# Patient Record
Sex: Male | Born: 1960 | Race: White | Hispanic: No | Marital: Married | Smoking: Never smoker
Health system: Southern US, Community
[De-identification: ages and names within clinical notes are randomized; demographics above are authoritative.]

## PROBLEM LIST (undated history)

## (undated) HISTORY — DX: Hemochromatosis, unspecified: E83.119

---

## 2006-01-25 ENCOUNTER — Ambulatory Visit: Payer: Self-pay | Admitting: Hematology & Oncology

## 2006-01-31 ENCOUNTER — Encounter: Admission: RE | Admit: 2006-01-31 | Discharge: 2006-01-31 | Payer: Self-pay | Admitting: Family Medicine

## 2006-02-04 LAB — CBC & DIFF AND RETIC
Basophils Absolute: 0 10*3/uL (ref 0.0–0.1)
Eosinophils Absolute: 0.1 10*3/uL (ref 0.0–0.5)
IRF: 0.22 (ref 0.070–0.380)
MONO#: 0.4 10*3/uL (ref 0.1–0.9)
NEUT%: 65.5 % (ref 40.0–75.0)
Platelets: 318 10*3/uL (ref 145–400)
RDW: 12.4 % (ref 11.2–14.6)
RETIC #: 41.2 10*3/uL (ref 31.8–103.9)
lymph#: 1.7 10*3/uL (ref 0.9–3.3)

## 2006-02-04 LAB — HEPATIC FUNCTION PANEL
ALT: 35 U/L (ref 0–40)
Albumin: 4.8 g/dL (ref 3.5–5.2)
Total Protein: 8.3 g/dL (ref 6.0–8.3)

## 2006-02-04 LAB — IRON AND TIBC
%SAT: 71 % — ABNORMAL HIGH (ref 20–55)
Iron: 234 ug/dL — ABNORMAL HIGH (ref 42–165)
TIBC: 330 ug/dL (ref 215–435)

## 2006-02-05 LAB — FERRITIN: Ferritin: 382 ng/mL — ABNORMAL HIGH (ref 22–322)

## 2006-02-11 LAB — CBC WITH DIFFERENTIAL/PLATELET
BASO%: 0.5 % (ref 0.0–2.0)
EOS%: 1.6 % (ref 0.0–7.0)
HCT: 44.2 % (ref 38.7–49.9)
LYMPH%: 38.1 % (ref 14.0–48.0)
MCH: 32.5 pg (ref 28.0–33.4)
MCHC: 34.8 g/dL (ref 32.0–35.9)
NEUT%: 52.3 % (ref 40.0–75.0)
Platelets: 313 10*3/uL (ref 145–400)
RBC: 4.73 10*6/uL (ref 4.20–5.71)
WBC: 5.3 10*3/uL (ref 4.0–10.0)

## 2006-02-18 LAB — CBC WITH DIFFERENTIAL/PLATELET
BASO%: 0.3 % (ref 0.0–2.0)
EOS%: 1 % (ref 0.0–7.0)
MCH: 32.3 pg (ref 28.0–33.4)
MCHC: 34.3 g/dL (ref 32.0–35.9)
MONO#: 0.4 10*3/uL (ref 0.1–0.9)
RBC: 4.48 10*6/uL (ref 4.20–5.71)
RDW: 12.2 % (ref 11.2–14.6)
WBC: 7.4 10*3/uL (ref 4.0–10.0)
lymph#: 2 10*3/uL (ref 0.9–3.3)

## 2006-02-25 LAB — CBC WITH DIFFERENTIAL/PLATELET
Basophils Absolute: 0.1 10*3/uL (ref 0.0–0.1)
EOS%: 1.6 % (ref 0.0–7.0)
Eosinophils Absolute: 0.1 10*3/uL (ref 0.0–0.5)
HGB: 14.3 g/dL (ref 13.0–17.1)
MCH: 33.3 pg (ref 28.0–33.4)
MONO#: 0.3 10*3/uL (ref 0.1–0.9)
NEUT#: 4 10*3/uL (ref 1.5–6.5)
RDW: 12.1 % (ref 11.2–14.6)
WBC: 6.6 10*3/uL (ref 4.0–10.0)
lymph#: 2.1 10*3/uL (ref 0.9–3.3)

## 2006-03-16 ENCOUNTER — Ambulatory Visit: Payer: Self-pay | Admitting: Hematology & Oncology

## 2006-03-18 LAB — CBC WITH DIFFERENTIAL/PLATELET
BASO%: 0.6 % (ref 0.0–2.0)
Eosinophils Absolute: 0.1 10*3/uL (ref 0.0–0.5)
HCT: 42.5 % (ref 38.7–49.9)
LYMPH%: 34.9 % (ref 14.0–48.0)
MCHC: 34.6 g/dL (ref 32.0–35.9)
MCV: 96.5 fL (ref 81.6–98.0)
MONO#: 0.5 10*3/uL (ref 0.1–0.9)
MONO%: 8.8 % (ref 0.0–13.0)
NEUT%: 53.8 % (ref 40.0–75.0)
Platelets: 292 10*3/uL (ref 145–400)
RBC: 4.4 10*6/uL (ref 4.20–5.71)
WBC: 6.1 10*3/uL (ref 4.0–10.0)

## 2006-04-15 LAB — FERRITIN: Ferritin: 111 ng/mL (ref 22–322)

## 2006-06-08 ENCOUNTER — Ambulatory Visit: Payer: Self-pay | Admitting: Hematology & Oncology

## 2006-08-03 ENCOUNTER — Ambulatory Visit: Payer: Self-pay | Admitting: Hematology & Oncology

## 2006-08-12 LAB — CBC WITH DIFFERENTIAL/PLATELET
Basophils Absolute: 0 10*3/uL (ref 0.0–0.1)
EOS%: 2.1 % (ref 0.0–7.0)
HGB: 14.1 g/dL (ref 13.0–17.1)
MCH: 33.2 pg (ref 28.0–33.4)
NEUT#: 2.6 10*3/uL (ref 1.5–6.5)
RBC: 4.26 10*6/uL (ref 4.20–5.71)
RDW: 12.6 % (ref 11.2–14.6)
lymph#: 1.9 10*3/uL (ref 0.9–3.3)

## 2006-08-12 LAB — FERRITIN: Ferritin: 143 ng/mL (ref 22–322)

## 2006-08-19 LAB — CBC WITH DIFFERENTIAL/PLATELET
Basophils Absolute: 0 10*3/uL (ref 0.0–0.1)
Eosinophils Absolute: 0.1 10*3/uL (ref 0.0–0.5)
HGB: 12.5 g/dL — ABNORMAL LOW (ref 13.0–17.1)
LYMPH%: 25.2 % (ref 14.0–48.0)
MONO#: 0.5 10*3/uL (ref 0.1–0.9)
NEUT#: 3.7 10*3/uL (ref 1.5–6.5)
Platelets: 277 10*3/uL (ref 145–400)
RBC: 3.75 10*6/uL — ABNORMAL LOW (ref 4.20–5.71)
WBC: 5.7 10*3/uL (ref 4.0–10.0)

## 2006-09-16 ENCOUNTER — Ambulatory Visit: Payer: Self-pay | Admitting: Hematology & Oncology

## 2006-09-16 LAB — CBC WITH DIFFERENTIAL/PLATELET
BASO%: 0.4 % (ref 0.0–2.0)
EOS%: 1.6 % (ref 0.0–7.0)
LYMPH%: 32.9 % (ref 14.0–48.0)
MCHC: 34.9 g/dL (ref 32.0–35.9)
MONO#: 0.4 10*3/uL (ref 0.1–0.9)
RBC: 4.66 10*6/uL (ref 4.20–5.71)
WBC: 4.5 10*3/uL (ref 4.0–10.0)
lymph#: 1.5 10*3/uL (ref 0.9–3.3)

## 2006-09-16 LAB — FERRITIN: Ferritin: 90 ng/mL (ref 22–322)

## 2006-10-14 LAB — CBC WITH DIFFERENTIAL/PLATELET
BASO%: 1.2 % (ref 0.0–2.0)
Eosinophils Absolute: 0.1 10*3/uL (ref 0.0–0.5)
LYMPH%: 30.5 % (ref 14.0–48.0)
MCHC: 34.1 g/dL (ref 32.0–35.9)
MCV: 95.7 fL (ref 81.6–98.0)
MONO%: 8.9 % (ref 0.0–13.0)
NEUT#: 3.4 10*3/uL (ref 1.5–6.5)
Platelets: 288 10*3/uL (ref 145–400)
RBC: 4.93 10*6/uL (ref 4.20–5.71)
RDW: 11.9 % (ref 11.2–14.6)
WBC: 5.8 10*3/uL (ref 4.0–10.0)

## 2006-10-14 LAB — FERRITIN: Ferritin: 100 ng/mL (ref 22–322)

## 2006-11-22 ENCOUNTER — Ambulatory Visit: Payer: Self-pay | Admitting: Hematology & Oncology

## 2006-12-09 LAB — CBC WITH DIFFERENTIAL/PLATELET
BASO%: 0.3 % (ref 0.0–2.0)
HCT: 43.8 % (ref 38.7–49.9)
MCHC: 35.4 g/dL (ref 32.0–35.9)
MONO#: 0.5 10*3/uL (ref 0.1–0.9)
NEUT%: 55 % (ref 40.0–75.0)
RDW: 12 % (ref 11.2–14.6)
WBC: 5.4 10*3/uL (ref 4.0–10.0)
lymph#: 1.8 10*3/uL (ref 0.9–3.3)

## 2006-12-09 LAB — IRON AND TIBC
%SAT: 42 % (ref 20–55)
Iron: 130 ug/dL (ref 42–165)
TIBC: 311 ug/dL (ref 215–435)
UIBC: 181 ug/dL

## 2007-01-04 ENCOUNTER — Ambulatory Visit: Payer: Self-pay | Admitting: Hematology & Oncology

## 2007-01-13 LAB — CBC WITH DIFFERENTIAL/PLATELET
EOS%: 2 % (ref 0.0–7.0)
LYMPH%: 32.2 % (ref 14.0–48.0)
MCH: 33.1 pg (ref 28.0–33.4)
MCHC: 35.5 g/dL (ref 32.0–35.9)
MCV: 93.2 fL (ref 81.6–98.0)
MONO%: 8.4 % (ref 0.0–13.0)
RBC: 4 10*6/uL — ABNORMAL LOW (ref 4.20–5.71)
RDW: 13.6 % (ref 11.2–14.6)

## 2007-01-13 LAB — FERRITIN: Ferritin: 53 ng/mL (ref 22–322)

## 2007-02-15 ENCOUNTER — Ambulatory Visit: Payer: Self-pay | Admitting: Hematology & Oncology

## 2007-02-17 LAB — CBC WITH DIFFERENTIAL/PLATELET
BASO%: 0.9 % (ref 0.0–2.0)
EOS%: 1.8 % (ref 0.0–7.0)
Eosinophils Absolute: 0.1 10*3/uL (ref 0.0–0.5)
HGB: 14.7 g/dL (ref 13.0–17.1)
MCV: 94.7 fL (ref 81.6–98.0)
NEUT#: 2.9 10*3/uL (ref 1.5–6.5)
Platelets: 268 10*3/uL (ref 145–400)

## 2007-02-17 LAB — FERRITIN: Ferritin: 25 ng/mL (ref 22–322)

## 2007-04-04 ENCOUNTER — Ambulatory Visit: Payer: Self-pay | Admitting: Hematology & Oncology

## 2007-04-07 LAB — CBC WITH DIFFERENTIAL/PLATELET
Basophils Absolute: 0 10*3/uL (ref 0.0–0.1)
Eosinophils Absolute: 0.1 10*3/uL (ref 0.0–0.5)
HGB: 14.9 g/dL (ref 13.0–17.1)
MONO%: 9.1 % (ref 0.0–13.0)
NEUT#: 2.9 10*3/uL (ref 1.5–6.5)
RBC: 4.48 10*6/uL (ref 4.20–5.71)
RDW: 11.8 % (ref 11.2–14.6)
WBC: 5.3 10*3/uL (ref 4.0–10.0)
lymph#: 1.8 10*3/uL (ref 0.9–3.3)

## 2007-04-07 LAB — FERRITIN: Ferritin: 43 ng/mL (ref 22–322)

## 2007-05-17 ENCOUNTER — Ambulatory Visit: Payer: Self-pay | Admitting: Hematology & Oncology

## 2007-05-19 LAB — FERRITIN: Ferritin: 39 ng/mL (ref 22–322)

## 2007-06-28 ENCOUNTER — Ambulatory Visit: Payer: Self-pay | Admitting: Hematology & Oncology

## 2007-06-30 LAB — CBC WITH DIFFERENTIAL/PLATELET
Eosinophils Absolute: 0.1 10*3/uL (ref 0.0–0.5)
HCT: 39.5 % (ref 38.7–49.9)
LYMPH%: 38.4 % (ref 14.0–48.0)
MCHC: 36.3 g/dL — ABNORMAL HIGH (ref 32.0–35.9)
MONO#: 0.3 10*3/uL (ref 0.1–0.9)
NEUT#: 2.1 10*3/uL (ref 1.5–6.5)
NEUT%: 51.3 % (ref 40.0–75.0)
Platelets: 248 10*3/uL (ref 145–400)
WBC: 4.2 10*3/uL (ref 4.0–10.0)

## 2007-09-20 ENCOUNTER — Ambulatory Visit: Payer: Self-pay | Admitting: Hematology & Oncology

## 2007-11-22 ENCOUNTER — Ambulatory Visit: Payer: Self-pay | Admitting: Hematology & Oncology

## 2007-11-24 LAB — CBC WITH DIFFERENTIAL/PLATELET
EOS%: 1.7 % (ref 0.0–7.0)
MCH: 31 pg (ref 28.0–33.4)
MCHC: 32.3 g/dL (ref 32.0–35.9)
MCV: 95.7 fL (ref 81.6–98.0)
MONO%: 8.5 % (ref 0.0–13.0)
RBC: 4.39 10*6/uL (ref 4.20–5.71)
RDW: 12.3 % (ref 11.2–14.6)

## 2007-11-24 LAB — FERRITIN: Ferritin: 37 ng/mL (ref 22–322)

## 2008-01-17 ENCOUNTER — Ambulatory Visit: Payer: Self-pay | Admitting: Hematology & Oncology

## 2008-03-07 ENCOUNTER — Encounter: Admission: RE | Admit: 2008-03-07 | Discharge: 2008-03-07 | Payer: Self-pay | Admitting: Otolaryngology

## 2008-03-19 ENCOUNTER — Ambulatory Visit: Payer: Self-pay | Admitting: Hematology & Oncology

## 2008-03-22 LAB — CBC WITH DIFFERENTIAL/PLATELET
BASO%: 0.8 % (ref 0.0–2.0)
Eosinophils Absolute: 0.1 10*3/uL (ref 0.0–0.5)
HCT: 41.3 % (ref 38.7–49.9)
MCH: 32.7 pg (ref 28.0–33.4)
MCHC: 35.6 g/dL (ref 32.0–35.9)
MCV: 91.8 fL (ref 81.6–98.0)
MONO#: 0.4 10*3/uL (ref 0.1–0.9)
MONO%: 7.7 % (ref 0.0–13.0)
NEUT%: 52.1 % (ref 40.0–75.0)
RBC: 4.5 10*6/uL (ref 4.20–5.71)
RDW: 13.2 % (ref 11.2–14.6)
WBC: 4.6 10*3/uL (ref 4.0–10.0)

## 2008-03-22 LAB — IRON AND TIBC
%SAT: 66 % — ABNORMAL HIGH (ref 20–55)
Iron: 207 ug/dL — ABNORMAL HIGH (ref 42–165)
TIBC: 312 ug/dL (ref 215–435)
UIBC: 105 ug/dL

## 2008-06-20 ENCOUNTER — Ambulatory Visit: Payer: Self-pay | Admitting: Hematology & Oncology

## 2008-09-12 ENCOUNTER — Ambulatory Visit: Payer: Self-pay | Admitting: Hematology & Oncology

## 2008-09-13 LAB — CBC WITH DIFFERENTIAL (CANCER CENTER ONLY)
BASO#: 0.1 10*3/uL (ref 0.0–0.2)
Eosinophils Absolute: 0.1 10*3/uL (ref 0.0–0.5)
HGB: 15.3 g/dL (ref 13.0–17.1)
LYMPH%: 18 % (ref 14.0–48.0)
MCH: 33.1 pg (ref 28.0–33.4)
MCV: 97 fL (ref 82–98)
MONO#: 0.6 10*3/uL (ref 0.1–0.9)
MONO%: 7.1 % (ref 0.0–13.0)
Platelets: 275 10*3/uL (ref 145–400)
RBC: 4.63 10*6/uL (ref 4.20–5.70)
WBC: 8.6 10*3/uL (ref 4.0–10.0)

## 2008-09-13 LAB — IRON AND TIBC
%SAT: 56 % — ABNORMAL HIGH (ref 20–55)
Iron: 175 ug/dL — ABNORMAL HIGH (ref 42–165)
TIBC: 313 ug/dL (ref 215–435)
UIBC: 138 ug/dL

## 2008-09-13 LAB — HEPATIC FUNCTION PANEL
Alkaline Phosphatase: 77 U/L (ref 39–117)
Bilirubin, Direct: 0.2 mg/dL (ref 0.0–0.3)
Total Bilirubin: 1.2 mg/dL (ref 0.3–1.2)

## 2008-12-12 ENCOUNTER — Ambulatory Visit: Payer: Self-pay | Admitting: Hematology & Oncology

## 2008-12-13 LAB — FERRITIN: Ferritin: 119 ng/mL (ref 22–322)

## 2009-03-13 ENCOUNTER — Ambulatory Visit: Payer: Self-pay | Admitting: Hematology & Oncology

## 2009-03-14 LAB — IRON AND TIBC
%SAT: 55 % (ref 20–55)
TIBC: 326 ug/dL (ref 215–435)

## 2009-03-14 LAB — CBC WITH DIFFERENTIAL (CANCER CENTER ONLY)
BASO#: 0 10*3/uL (ref 0.0–0.2)
BASO%: 0.5 % (ref 0.0–2.0)
EOS%: 3.2 % (ref 0.0–7.0)
HCT: 46.5 % (ref 38.7–49.9)
LYMPH%: 33.2 % (ref 14.0–48.0)
MCHC: 33 g/dL (ref 32.0–35.9)
MCV: 99 fL — ABNORMAL HIGH (ref 82–98)
MONO#: 0.3 10*3/uL (ref 0.1–0.9)
NEUT%: 54.8 % (ref 40.0–80.0)
RDW: 9.8 % — ABNORMAL LOW (ref 10.5–14.6)

## 2009-03-14 LAB — HEPATIC FUNCTION PANEL
Alkaline Phosphatase: 61 U/L (ref 39–117)
Bilirubin, Direct: 0.2 mg/dL (ref 0.0–0.3)
Indirect Bilirubin: 0.8 mg/dL (ref 0.0–0.9)
Total Bilirubin: 1 mg/dL (ref 0.3–1.2)

## 2009-06-19 ENCOUNTER — Ambulatory Visit: Payer: Self-pay | Admitting: Hematology & Oncology

## 2009-06-20 LAB — FERRITIN: Ferritin: 64 ng/mL (ref 22–322)

## 2009-09-18 ENCOUNTER — Ambulatory Visit: Payer: Self-pay | Admitting: Hematology & Oncology

## 2009-09-19 LAB — CBC WITH DIFFERENTIAL (CANCER CENTER ONLY)
Eosinophils Absolute: 0.1 10*3/uL (ref 0.0–0.5)
HCT: 42 % (ref 38.7–49.9)
HGB: 14.7 g/dL (ref 13.0–17.1)
LYMPH%: 40.3 % (ref 14.0–48.0)
MCV: 93 fL (ref 82–98)
MONO#: 0.2 10*3/uL (ref 0.1–0.9)
NEUT%: 51.6 % (ref 40.0–80.0)
RBC: 4.52 10*6/uL (ref 4.20–5.70)
WBC: 4.2 10*3/uL (ref 4.0–10.0)

## 2009-12-23 ENCOUNTER — Ambulatory Visit: Payer: Self-pay | Admitting: Hematology & Oncology

## 2010-03-26 ENCOUNTER — Ambulatory Visit: Payer: Self-pay | Admitting: Hematology & Oncology

## 2010-04-03 LAB — COMPREHENSIVE METABOLIC PANEL
AST: 26 U/L (ref 0–37)
Albumin: 4.6 g/dL (ref 3.5–5.2)
Alkaline Phosphatase: 66 U/L (ref 39–117)
BUN: 14 mg/dL (ref 6–23)
CO2: 23 mEq/L (ref 19–32)
Calcium: 9.4 mg/dL (ref 8.4–10.5)
Chloride: 103 mEq/L (ref 96–112)
Creatinine, Ser: 1.1 mg/dL (ref 0.40–1.50)
Glucose, Bld: 92 mg/dL (ref 70–99)
Sodium: 137 mEq/L (ref 135–145)

## 2010-04-03 LAB — CBC WITH DIFFERENTIAL (CANCER CENTER ONLY)
BASO#: 0 10*3/uL (ref 0.0–0.2)
BASO%: 0.6 % (ref 0.0–2.0)
EOS%: 4.4 % (ref 0.0–7.0)
Eosinophils Absolute: 0.2 10*3/uL (ref 0.0–0.5)
HCT: 43.8 % (ref 38.7–49.9)
MCHC: 34.9 g/dL (ref 32.0–35.9)
NEUT%: 50.5 % (ref 40.0–80.0)
RDW: 10.7 % (ref 10.5–14.6)
WBC: 4.3 10*3/uL (ref 4.0–10.0)

## 2010-06-30 ENCOUNTER — Ambulatory Visit: Payer: Self-pay | Admitting: Hematology & Oncology

## 2010-07-01 LAB — CBC WITH DIFFERENTIAL (CANCER CENTER ONLY)
BASO#: 0 10*3/uL (ref 0.0–0.2)
BASO%: 0.5 % (ref 0.0–2.0)
EOS%: 3.8 % (ref 0.0–7.0)
LYMPH%: 37.5 % (ref 14.0–48.0)
MCV: 99 fL — ABNORMAL HIGH (ref 82–98)
MONO#: 0.3 10*3/uL (ref 0.1–0.9)
NEUT%: 50.8 % (ref 40.0–80.0)
Platelets: 254 10*3/uL (ref 145–400)
RDW: 10.7 % (ref 10.5–14.6)

## 2010-07-01 LAB — FERRITIN: Ferritin: 134 ng/mL (ref 22–322)

## 2010-07-01 LAB — IRON AND TIBC: UIBC: 189 ug/dL

## 2010-09-24 ENCOUNTER — Ambulatory Visit: Payer: Self-pay | Admitting: Hematology & Oncology

## 2010-09-25 LAB — COMPREHENSIVE METABOLIC PANEL
AST: 25 U/L (ref 0–37)
Albumin: 4.3 g/dL (ref 3.5–5.2)
Alkaline Phosphatase: 65 U/L (ref 39–117)
BUN: 15 mg/dL (ref 6–23)
CO2: 22 mEq/L (ref 19–32)
Calcium: 9.6 mg/dL (ref 8.4–10.5)
Chloride: 104 mEq/L (ref 96–112)
Potassium: 4.5 mEq/L (ref 3.5–5.3)
Sodium: 137 mEq/L (ref 135–145)
Total Bilirubin: 0.8 mg/dL (ref 0.3–1.2)
Total Protein: 7.8 g/dL (ref 6.0–8.3)

## 2010-09-25 LAB — CBC WITH DIFFERENTIAL (CANCER CENTER ONLY)
EOS%: 3.7 % (ref 0.0–7.0)
MONO#: 0.4 10*3/uL (ref 0.1–0.9)
Platelets: 244 10*3/uL (ref 145–400)
RBC: 4.6 10*6/uL (ref 4.20–5.70)
WBC: 4.8 10*3/uL (ref 4.0–10.0)

## 2010-09-25 LAB — IRON AND TIBC
%SAT: 59 % — ABNORMAL HIGH (ref 20–55)
UIBC: 122 ug/dL

## 2010-09-25 LAB — FERRITIN: Ferritin: 65 ng/mL (ref 22–322)

## 2010-12-18 ENCOUNTER — Other Ambulatory Visit: Payer: Self-pay | Admitting: Hematology & Oncology

## 2010-12-18 ENCOUNTER — Encounter (HOSPITAL_BASED_OUTPATIENT_CLINIC_OR_DEPARTMENT_OTHER): Payer: BC Managed Care – PPO | Admitting: Hematology & Oncology

## 2010-12-18 LAB — CBC WITH DIFFERENTIAL (CANCER CENTER ONLY)
Eosinophils Absolute: 0.1 10*3/uL (ref 0.0–0.5)
MCH: 32.9 pg (ref 28.0–33.4)
MONO#: 0.5 10*3/uL (ref 0.1–0.9)
NEUT#: 2.1 10*3/uL (ref 1.5–6.5)
RBC: 4.56 10*6/uL (ref 4.20–5.70)
WBC: 4.2 10*3/uL (ref 4.0–10.0)

## 2011-01-06 ENCOUNTER — Encounter (HOSPITAL_BASED_OUTPATIENT_CLINIC_OR_DEPARTMENT_OTHER): Payer: BC Managed Care – PPO | Admitting: Hematology & Oncology

## 2011-05-06 ENCOUNTER — Encounter (HOSPITAL_BASED_OUTPATIENT_CLINIC_OR_DEPARTMENT_OTHER): Payer: BC Managed Care – PPO | Admitting: Hematology & Oncology

## 2011-05-06 ENCOUNTER — Other Ambulatory Visit: Payer: Self-pay | Admitting: Hematology & Oncology

## 2011-05-06 LAB — CBC WITH DIFFERENTIAL (CANCER CENTER ONLY)
BASO%: 0.3 % (ref 0.0–2.0)
EOS%: 1.8 % (ref 0.0–7.0)
HCT: 41 % (ref 38.7–49.9)
HGB: 15.1 g/dL (ref 13.0–17.1)
LYMPH%: 31 % (ref 14.0–48.0)
MCH: 34.4 pg — ABNORMAL HIGH (ref 28.0–33.4)
MCHC: 36.8 g/dL — ABNORMAL HIGH (ref 32.0–35.9)
MONO%: 9 % (ref 0.0–13.0)
NEUT%: 57.9 % (ref 40.0–80.0)
Platelets: 215 10*3/uL (ref 145–400)
RBC: 4.39 10*6/uL (ref 4.20–5.70)

## 2011-05-20 ENCOUNTER — Encounter (HOSPITAL_BASED_OUTPATIENT_CLINIC_OR_DEPARTMENT_OTHER): Payer: BC Managed Care – PPO | Admitting: Hematology & Oncology

## 2011-05-27 ENCOUNTER — Encounter (HOSPITAL_BASED_OUTPATIENT_CLINIC_OR_DEPARTMENT_OTHER): Payer: BC Managed Care – PPO | Admitting: Hematology & Oncology

## 2011-10-21 ENCOUNTER — Other Ambulatory Visit (HOSPITAL_BASED_OUTPATIENT_CLINIC_OR_DEPARTMENT_OTHER): Payer: BC Managed Care – PPO | Admitting: Lab

## 2011-10-21 ENCOUNTER — Other Ambulatory Visit: Payer: Self-pay | Admitting: Hematology & Oncology

## 2011-10-21 ENCOUNTER — Ambulatory Visit (HOSPITAL_BASED_OUTPATIENT_CLINIC_OR_DEPARTMENT_OTHER): Payer: BC Managed Care – PPO | Admitting: Hematology & Oncology

## 2011-10-21 ENCOUNTER — Encounter: Payer: Self-pay | Admitting: Hematology & Oncology

## 2011-10-21 ENCOUNTER — Telehealth: Payer: Self-pay | Admitting: Hematology & Oncology

## 2011-10-21 HISTORY — DX: Hemochromatosis, unspecified: E83.119

## 2011-10-21 LAB — COMPREHENSIVE METABOLIC PANEL
ALT: 31 U/L (ref 0–53)
ALT: 31 U/L (ref 0–53)
AST: 26 U/L (ref 0–37)
Alkaline Phosphatase: 67 U/L (ref 39–117)
Alkaline Phosphatase: 67 U/L (ref 39–117)
BUN: 18 mg/dL (ref 6–23)
CO2: 22 mEq/L (ref 19–32)
CO2: 22 mEq/L (ref 19–32)
Creatinine, Ser: 0.94 mg/dL (ref 0.50–1.35)
Glucose, Bld: 97 mg/dL (ref 70–99)
Glucose, Bld: 97 mg/dL (ref 70–99)
Potassium: 4.5 mEq/L (ref 3.5–5.3)
Sodium: 137 mEq/L (ref 135–145)
Sodium: 137 mEq/L (ref 135–145)
Total Bilirubin: 1 mg/dL (ref 0.3–1.2)
Total Bilirubin: 1 mg/dL (ref 0.3–1.2)
Total Bilirubin: 1 mg/dL (ref 0.3–1.2)
Total Protein: 8.2 g/dL (ref 6.0–8.3)
Total Protein: 8.2 g/dL (ref 6.0–8.3)

## 2011-10-21 LAB — IRON AND TIBC
%SAT: 56 % — ABNORMAL HIGH (ref 20–55)
%SAT: 56 % — ABNORMAL HIGH (ref 20–55)
Iron: 189 ug/dL — ABNORMAL HIGH (ref 42–165)
Iron: 189 ug/dL — ABNORMAL HIGH (ref 42–165)
Iron: 189 ug/dL — ABNORMAL HIGH (ref 42–165)
TIBC: 335 ug/dL (ref 215–435)
TIBC: 335 ug/dL (ref 215–435)
UIBC: 146 ug/dL (ref 125–400)
UIBC: 146 ug/dL (ref 125–400)

## 2011-10-21 LAB — CBC WITH DIFFERENTIAL (CANCER CENTER ONLY)
Eosinophils Absolute: 0.1 10*3/uL (ref 0.0–0.5)
HCT: 43.4 % (ref 38.7–49.9)
LYMPH%: 33.3 % (ref 14.0–48.0)
MCH: 33.7 pg — ABNORMAL HIGH (ref 28.0–33.4)
MCV: 96 fL (ref 82–98)
MONO#: 0.5 10*3/uL (ref 0.1–0.9)
MONO%: 10.1 % (ref 0.0–13.0)
NEUT%: 53.6 % (ref 40.0–80.0)
RBC: 4.54 10*6/uL (ref 4.20–5.70)
RDW: 12.1 % (ref 11.1–15.7)
WBC: 4.6 10*3/uL (ref 4.0–10.0)

## 2011-10-21 LAB — FERRITIN: Ferritin: 104 ng/mL (ref 22–322)

## 2011-10-21 NOTE — Progress Notes (Signed)
This office note has been dictated.

## 2011-10-21 NOTE — Telephone Encounter (Signed)
I called Austin Summers and told hin that the ferritin is 104 and that we would need to take 1 unit of blood.  He will be back from [place] on 1/20 and will call us to set up a phlebotomy.  Pete E.

## 2011-10-21 NOTE — Progress Notes (Signed)
CC:   Austin Summers. Austin Summers, M.D.  DIAGNOSIS:  Hemochromatosis, C2A2Y homozygous.  CURRENT THERAPY:  Phlebotomy to maintain ferritin less than 100.  INTERVAL HISTORY:  Austin Summers came in for followup.  We see him every 6 months.  He is doing okay.  He unfortunately lost both of  his parents in the past year.  I think his mom had Alzheimer's.  His father had brain cancer.  He has been going back and forth to [place] for business.  He is actually headed back to [place] this weekend.  He gets phlebotomized maybe 2-3 times a year.  His last phlebotomy was back in August.  He has had no change in medications.  He is a little tired because of all this traveling.  He has been trying to stay active.  His kids are doing well.  His appetite has been doing okay.  He has not noticed any change in bowel or bladder habits.  His last ferritin back in July prior to his phlebotomy was 120.  PHYSICAL EXAM:  General:  This is a well-developed, well-nourished, white gentleman in no obvious distress.  Vital Signs:  Temperature of 97.7, pulse 70, respiratory rate 18, blood pressure 125/72.  Weight is 196.  Head and Neck Exam:  Normocephalic, atraumatic skull.  There are no ocular or oral lesions.  There are no palpable cervical or supraclavicular lymph nodes.  Lungs:  Clear bilaterally.  Cardiac Exam: Regular rate and rhythm with a normal S1, S2.  There are no murmurs, rubs, or bruits.  Abdominal Exam:  Soft with good bowel sounds.  There is no palpable abdominal mass.  There is no fluid wave.  There is no palpable hepatosplenomegaly.  Back Exam:  No tenderness over the spine, ribs, or hips.  Extremities:  No clubbing, cyanosis, or edema. Neurological Exam:  No focal neurological deficits.  LABORATORY STUDIES:  White count of 4.6, hemoglobin 13.3, hematocrit 43.4, platelet count 240.  MCV 96.  His ferritin is 104.  His iron saturation is 56%.  IMPRESSION:  Austin Summers is a 51 year old gentleman with  hemochromatosis. Again, he is homozygous C2A2Y mutation.  He will need to be phlebotomized.  I will get him set up with a phlebotomy probably when he gets back from [place].  He had checked his ferritin every 3 months.  I will see back in 6 months.    ______________________________ [name], M.D. PRE/MEDQ  D:  10/21/2011  T:  10/21/2011  Job:  [id number]   ADDENDUM:  Ferritin is 104.  %Sat is 56%.

## 2011-10-22 ENCOUNTER — Telehealth: Payer: Self-pay | Admitting: Hematology & Oncology

## 2011-10-22 NOTE — Telephone Encounter (Signed)
Per order to sch apt follow ups.  i mailed apt calendar to pt's home

## 2011-10-25 ENCOUNTER — Other Ambulatory Visit: Payer: Self-pay | Admitting: Hematology & Oncology

## 2011-11-05 ENCOUNTER — Telehealth: Payer: Self-pay | Admitting: Hematology & Oncology

## 2011-11-05 NOTE — Telephone Encounter (Signed)
Pt called and cx 11/05/11 apt due to bad weather.  He resch for 11/08/11

## 2011-11-08 ENCOUNTER — Ambulatory Visit (HOSPITAL_BASED_OUTPATIENT_CLINIC_OR_DEPARTMENT_OTHER): Payer: BC Managed Care – PPO

## 2011-11-08 NOTE — Progress Notes (Signed)
Austin Summers presents today for phlebotomy per MD orders. Phlebotomy procedure started at 1445 and ended at 500 grams removed. Patient observed for 30 minutes after procedure without any incident. Patient tolerated procedure well. IV needle removed intact.

## 2012-01-20 ENCOUNTER — Other Ambulatory Visit: Payer: BC Managed Care – PPO | Admitting: Lab

## 2012-01-20 ENCOUNTER — Other Ambulatory Visit (HOSPITAL_BASED_OUTPATIENT_CLINIC_OR_DEPARTMENT_OTHER): Payer: BC Managed Care – PPO | Admitting: Lab

## 2012-01-20 LAB — CBC WITH DIFFERENTIAL (CANCER CENTER ONLY)
BASO#: 0 10*3/uL (ref 0.0–0.2)
Eosinophils Absolute: 0 10*3/uL (ref 0.0–0.5)
HCT: 43.9 % (ref 38.7–49.9)
HGB: 15.3 g/dL (ref 13.0–17.1)
MCH: 33.8 pg — ABNORMAL HIGH (ref 28.0–33.4)
MONO%: 8.3 % (ref 0.0–13.0)
NEUT#: 9.8 10*3/uL — ABNORMAL HIGH (ref 1.5–6.5)
NEUT%: 81.4 % — ABNORMAL HIGH (ref 40.0–80.0)
RBC: 4.53 10*6/uL (ref 4.20–5.70)

## 2012-01-20 LAB — IRON AND TIBC
TIBC: 271 ug/dL (ref 215–435)
UIBC: 259 ug/dL (ref 125–400)

## 2012-01-28 ENCOUNTER — Telehealth: Payer: Self-pay | Admitting: *Deleted

## 2012-01-28 NOTE — Telephone Encounter (Addendum)
Message copied by Mirian Capuchin on Fri Jan 28, 2012  4:18 PM ------      Message from: Arlan Organ R      Created: Thu Jan 27, 2012 11:04 AM       Call - ferritin is too high.  Need phlebotomy q wk x 2 wks.  Need to fit this into his travel schedule.  Cindee Lame This message given to pt.  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

## 2012-02-02 ENCOUNTER — Telehealth: Payer: Self-pay | Admitting: Hematology & Oncology

## 2012-02-02 ENCOUNTER — Telehealth: Payer: Self-pay | Admitting: *Deleted

## 2012-02-02 NOTE — Telephone Encounter (Signed)
Called patient to let him know that his ferritin is too high and he needs a phlebotomy every week x 2 weeks.  Left message on his personal answering machine to call Raiford Noble to schedule

## 2012-02-02 NOTE — Telephone Encounter (Signed)
Message copied by Anselm Jungling on Wed Feb 02, 2012  2:23 PM ------      Message from: Arlan Organ R      Created: Thu Jan 27, 2012 11:04 AM       Call - ferritin is too high.  Need phlebotomy q wk x 2 wks.  Need to fit this into his travel schedule.  Cindee Lame

## 2012-02-02 NOTE — Telephone Encounter (Signed)
Pt called made 4-29 and 5-6 phlebotomy's

## 2012-02-07 ENCOUNTER — Ambulatory Visit: Payer: BC Managed Care – PPO

## 2012-02-07 NOTE — Patient Instructions (Signed)
Therapeutic Phlebotomy Therapeutic phlebotomy is the controlled removal of blood from your body for the purpose of treating a medical condition. It is similar to donating blood. Usually, about a pint (470 mL) of blood is removed. The average adult has 9 to 12 pints (4.3 to 5.7 L) of blood. Therapeutic phlebotomy may be used to treat the following medical conditions:  Hemochromatosis. This is a condition in which there is too much iron in the blood.   Polycythemia vera. This is a condition in which there are too many red cells in the blood.   Porphyria cutanea tarda. This is a disease usually passed from one generation to the next (inherited). It is a condition in which an important part of hemoglobin is not made properly. This results in the build up of abnormal amounts of porphyrins in the body.   Sickle cell disease. This is an inherited disease. It is a condition in which the red blood cells form an abnormal crescent shape rather than a round shape.  LET YOUR CAREGIVER KNOW ABOUT:  Allergies.   Medicines taken including herbs, eyedrops, over-the-counter medicines, and creams.   Use of steroids (by mouth or creams).   Previous problems with anesthetics or numbing medicine.   History of blood clots.   History of bleeding or blood problems.   Previous surgery.   Possibility of pregnancy, if this applies.  RISKS AND COMPLICATIONS This is a simple and safe procedure. Problems are unlikely. However, problems can occur and may include:  Nausea or lightheadedness.   Low blood pressure.   Soreness, bleeding, swelling, or bruising at the needle insertion site.   Infection.  BEFORE THE PROCEDURE  This is a procedure that can be done as an outpatient. Confirm the time that you need to arrive for your procedure. Confirm whether there is a need to fast or withhold any medications. It is helpful to wear clothing with sleeves that can be raised above the elbow. A blood sample may be done  to determine the amount of red blood cells or iron in your blood. Plan ahead of time to have someone drive you home after the procedure. PROCEDURE The entire procedure from preparation through recovery takes about 1 hour. The actual collection takes about 10 to 15 minutes.  A needle will be inserted into your vein.   Tubing and a collection bag will be attached to that needle.   Blood will flow through the needle and tubing into the collection bag.   You may be asked to open and close your hand slowly and continuously during the entire collection.   Once the specified amount of blood has been removed from your body, the collection bag and tubing will be clamped.   The needle will be removed.   Pressure will be held on the site of the needle insertion to stop the bleeding. Then a bandage will be placed over the needle insertion site.  AFTER THE PROCEDURE  Your recovery will be assessed and monitored. If there are no problems, as an outpatient, you should be able to go home shortly after the procedure.  Document Released: 03/01/2011 Document Revised: 09/16/2011 Document Reviewed: 03/01/2011 Exit[employer] Patient Information 2012 [employer]. 

## 2012-02-07 NOTE — Progress Notes (Signed)
Austin Summers presents today for phlebotomy per MD orders. Phlebotomy procedure started at 1440 and ended at 1505 Approximately removed d/t needled clotting x 2. A Bickling attempted right arm with no results.  Patient instructed to take ASA as prescribed.  Shared with nurse he does not always take.  Appointment rescheduled for Friday 02-11-12. Patient observed for 30 minutes after procedure without any incident. Patient tolerated procedure well. IV needle removed intact. Austin Summers, Austin Summers [employer]

## 2012-02-11 ENCOUNTER — Ambulatory Visit (HOSPITAL_BASED_OUTPATIENT_CLINIC_OR_DEPARTMENT_OTHER): Payer: BC Managed Care – PPO

## 2012-02-11 NOTE — Progress Notes (Signed)
Austin Summers presents today for phlebotomy per MD orders. Phlebotomy procedure started at 1540 and ended at 1555. Approximately  removed. Patient observed for 30 minutes after procedure without any incident. Patient tolerated procedure well. IV needle removed intact.

## 2012-02-11 NOTE — Patient Instructions (Signed)

## 2012-02-17 ENCOUNTER — Ambulatory Visit: Payer: BC Managed Care – PPO

## 2012-02-17 NOTE — Progress Notes (Signed)
Unable to do Phlebotomy di spite 3 attempts.  Pt states he would come back next week.

## 2012-02-17 NOTE — Patient Instructions (Signed)

## 2012-02-21 ENCOUNTER — Ambulatory Visit (HOSPITAL_BASED_OUTPATIENT_CLINIC_OR_DEPARTMENT_OTHER): Payer: BC Managed Care – PPO

## 2012-02-21 NOTE — Patient Instructions (Signed)
Therapeutic Phlebotomy Therapeutic phlebotomy is the controlled removal of blood from your body for the purpose of treating a medical condition. It is similar to donating blood. Usually, about a pint (470 mL) of blood is removed. The average adult has 9 to 12 pints (4.3 to 5.7 L) of blood. Therapeutic phlebotomy may be used to treat the following medical conditions:  Hemochromatosis. This is a condition in which there is too much iron in the blood.   Polycythemia vera. This is a condition in which there are too many red cells in the blood.   Porphyria cutanea tarda. This is a disease usually passed from one generation to the next (inherited). It is a condition in which an important part of hemoglobin is not made properly. This results in the build up of abnormal amounts of porphyrins in the body.   Sickle cell disease. This is an inherited disease. It is a condition in which the red blood cells form an abnormal crescent shape rather than a round shape.  LET YOUR CAREGIVER KNOW ABOUT:  Allergies.   Medicines taken including herbs, eyedrops, over-the-counter medicines, and creams.   Use of steroids (by mouth or creams).   Previous problems with anesthetics or numbing medicine.   History of blood clots.   History of bleeding or blood problems.   Previous surgery.   Possibility of pregnancy, if this applies.  RISKS AND COMPLICATIONS This is a simple and safe procedure. Problems are unlikely. However, problems can occur and may include:  Nausea or lightheadedness.   Low blood pressure.   Soreness, bleeding, swelling, or bruising at the needle insertion site.   Infection.  BEFORE THE PROCEDURE  This is a procedure that can be done as an outpatient. Confirm the time that you need to arrive for your procedure. Confirm whether there is a need to fast or withhold any medications. It is helpful to wear clothing with sleeves that can be raised above the elbow. A blood sample may be done  to determine the amount of red blood cells or iron in your blood. Plan ahead of time to have someone drive you home after the procedure. PROCEDURE The entire procedure from preparation through recovery takes about 1 hour. The actual collection takes about 10 to 15 minutes.  A needle will be inserted into your vein.   Tubing and a collection bag will be attached to that needle.   Blood will flow through the needle and tubing into the collection bag.   You may be asked to open and close your hand slowly and continuously during the entire collection.   Once the specified amount of blood has been removed from your body, the collection bag and tubing will be clamped.   The needle will be removed.   Pressure will be held on the site of the needle insertion to stop the bleeding. Then a bandage will be placed over the needle insertion site.  AFTER THE PROCEDURE  Your recovery will be assessed and monitored. If there are no problems, as an outpatient, you should be able to go home shortly after the procedure.  Document Released: 03/01/2011 Document Revised: 09/16/2011 Document Reviewed: 03/01/2011 Exit[employer] Patient Information 2012 [employer]. 

## 2012-02-21 NOTE — Progress Notes (Signed)
Austin Summers presents today for phlebotomy per MD orders. Phlebotomy procedure started at 1350 ended at1400  500grams removed. Patient observed for 30 minutes after procedure without any incident. Patient tolerated procedure well. IV needle removed intact.

## 2012-04-20 ENCOUNTER — Ambulatory Visit (HOSPITAL_BASED_OUTPATIENT_CLINIC_OR_DEPARTMENT_OTHER): Payer: BC Managed Care – PPO | Admitting: Hematology & Oncology

## 2012-04-20 ENCOUNTER — Other Ambulatory Visit (HOSPITAL_BASED_OUTPATIENT_CLINIC_OR_DEPARTMENT_OTHER): Payer: BC Managed Care – PPO | Admitting: Lab

## 2012-04-20 LAB — CBC WITH DIFFERENTIAL (CANCER CENTER ONLY)
BASO#: 0 10*3/uL (ref 0.0–0.2)
BASO%: 0.8 % (ref 0.0–2.0)
EOS%: 3.5 % (ref 0.0–7.0)
HGB: 14.1 g/dL (ref 13.0–17.1)
LYMPH#: 1.6 10*3/uL (ref 0.9–3.3)
MCHC: 34.6 g/dL (ref 32.0–35.9)
MONO%: 9.5 % (ref 0.0–13.0)
NEUT#: 1.6 10*3/uL (ref 1.5–6.5)
Platelets: 202 10*3/uL (ref 145–400)
RDW: 12.1 % (ref 11.1–15.7)

## 2012-04-20 LAB — FERRITIN: Ferritin: 27 ng/mL (ref 22–322)

## 2012-04-20 NOTE — Progress Notes (Signed)
This office note has been dictated.

## 2012-04-21 NOTE — Progress Notes (Signed)
CC:   Bryan Lemma. Manus Gunning, M.D.  DIAGNOSIS:  Hemochromatosis, C282Y homozygous mutation.  CURRENT THERAPY:  Phlebotomy to maintain ferritin less than 100.  INTERIM HISTORY:  Mr. Paolucci comes in for his followup.  He is doing fairly well.  He is getting ready go to [place] this weekend.  He goes several times a year.  He last phlebotomized back in April.  At that point in time, his ferritin was 128.  We are doing good job in making sure that his iron levels are staying down.  He has had no problem with joint aches or pains. He is losing a little bit of weight.  He is exercising.  He is eating better.  PHYSICAL EXAMINATION:  This is a well-developed, well-nourished white gentleman in no obvious distress.  Vital signs:  Temperature of 97.4, pulse 78, respiratory rate 18, blood pressure 121/76.  Weight is 177. Head and neck:  Normocephalic, atraumatic skull.  There are no ocular or oral lesions.  There are no palpable cervical or supraclavicular lymph nodes.  Lungs:  Clear to percussion and auscultation bilaterally. Cardiac:  Regular rate and rhythm with a normal S1 and S2.  There are no murmurs or rubs, or bruits.  Abdomen:  Soft with good bowel sounds. There is no palpable abdominal mass.  There is no fluid wave.  There is no palpable hepatosplenomegaly.  Back:  No tenderness of the spine, ribs, or hips.  Extremities:  No clubbing, cyanosis or edema. Neurological:  No focal neurological deficits.  LABORATORY STUDIES:  White cell count is 3.7, hemoglobin 14.1, hematocrit 40.7, platelet count is 202.  IMPRESSION:  Mr. Aguayo is a 51 year old gentleman with hemochromatosis. We are maintaining his iron levels nicely.  He has had no complications from hemochromatosis.  We will see what his ferritin level is.  Again, he typically gets phlebotomized maybe a couple of times a year.  We will have him come back in 3 months for lab work.  I will see him back in 6  months.    ______________________________ [name], M.D. PRE/MEDQ  D:  04/20/2012  T:  04/21/2012  Job:  [id number]

## 2012-05-01 ENCOUNTER — Telehealth: Payer: Self-pay | Admitting: Hematology & Oncology

## 2012-05-01 NOTE — Telephone Encounter (Signed)
Faxed records to [facility] per request.

## 2012-07-20 ENCOUNTER — Other Ambulatory Visit: Payer: BC Managed Care – PPO | Admitting: Lab

## 2012-07-20 LAB — CBC WITH DIFFERENTIAL (CANCER CENTER ONLY)
BASO#: 0 10*3/uL (ref 0.0–0.2)
Eosinophils Absolute: 0.2 10*3/uL (ref 0.0–0.5)
HGB: 15.3 g/dL (ref 13.0–17.1)
LYMPH#: 1.6 10*3/uL (ref 0.9–3.3)
MONO#: 0.5 10*3/uL (ref 0.1–0.9)
NEUT#: 2.2 10*3/uL (ref 1.5–6.5)
Platelets: 217 10*3/uL (ref 145–400)
RBC: 4.61 10*6/uL (ref 4.20–5.70)
WBC: 4.4 10*3/uL (ref 4.0–10.0)

## 2012-07-20 LAB — FERRITIN: Ferritin: 41 ng/mL (ref 22–322)

## 2012-07-20 LAB — IRON AND TIBC
Iron: 143 ug/dL (ref 42–165)
TIBC: 307 ug/dL (ref 215–435)
UIBC: 164 ug/dL (ref 125–400)

## 2012-07-31 ENCOUNTER — Telehealth: Payer: Self-pay | Admitting: Hematology & Oncology

## 2012-07-31 NOTE — Telephone Encounter (Addendum)
Message copied by Cathi Roan on Mon Jul 31, 2012  3:32 PM ------      Message from: [place]well, [place] N      Created: Fri Jul 28, 2012 12:35 PM                   ----- Message -----         From: [name], MD         Sent: 07/27/2012   7:44 AM           To: Nanci Pina Nurse Hp            Please call and let him know that his iron is still great. He does not need to be phlebotomized. Thanks. Pete  07-31-12    3:32pm.   Called and spoke to patient on home phone regarding above MD message. Pt verbalized understanding. Lupita Raider LPN

## 2012-10-17 ENCOUNTER — Telehealth: Payer: Self-pay | Admitting: Hematology & Oncology

## 2012-10-17 NOTE — Telephone Encounter (Signed)
Patient called and cx 10/19/12 and resch for 11/17/12

## 2012-10-19 ENCOUNTER — Ambulatory Visit: Payer: BC Managed Care – PPO | Admitting: Hematology & Oncology

## 2012-10-19 ENCOUNTER — Other Ambulatory Visit: Payer: BC Managed Care – PPO | Admitting: Lab

## 2012-11-17 ENCOUNTER — Other Ambulatory Visit (HOSPITAL_BASED_OUTPATIENT_CLINIC_OR_DEPARTMENT_OTHER): Payer: BC Managed Care – PPO | Admitting: Lab

## 2012-11-17 ENCOUNTER — Ambulatory Visit (HOSPITAL_BASED_OUTPATIENT_CLINIC_OR_DEPARTMENT_OTHER): Payer: BC Managed Care – PPO | Admitting: Hematology & Oncology

## 2012-11-17 DIAGNOSIS — R1011 Right upper quadrant pain: Secondary | ICD-10-CM

## 2012-11-17 LAB — CBC WITH DIFFERENTIAL (CANCER CENTER ONLY)
BASO#: 0 10*3/uL (ref 0.0–0.2)
HCT: 43.2 % (ref 38.7–49.9)
HGB: 15.1 g/dL (ref 13.0–17.1)
LYMPH#: 1.4 10*3/uL (ref 0.9–3.3)
MONO#: 0.5 10*3/uL (ref 0.1–0.9)
NEUT%: 53.2 % (ref 40.0–80.0)
RBC: 4.42 10*6/uL (ref 4.20–5.70)
WBC: 4.5 10*3/uL (ref 4.0–10.0)

## 2012-11-17 NOTE — Progress Notes (Signed)
This office note has been dictated.

## 2012-11-18 NOTE — Progress Notes (Signed)
CC:   Bryan Lemma. Manus Gunning, M.D.  DIAGNOSIS:  Hemochromatosis (C2A2Y, homozygous mutation).  CURRENT THERAPY:  Phlebotomy to maintain ferritin less than 100.  INTERIM HISTORY:  Mr. Austin Summers comes in for followup.  He is doing fairly well.  We last saw him back in July. He has not been phlebotomized since April of last year.  He feels good. He does have some right upper quadrant abdominal discomfort.  We will go ahead and get an ultrasound of this area.  I do not think we will find anything, but yet we probably do need to check.  He has had no fevers, sweats or chills.  He has had no nausea or vomiting.  He has had no cough or shortness of breath.  He denies any kind of joint issues.  There has been no leg swelling.  PHYSICAL EXAMINATION:  This is a well-developed, well-nourished white gentleman in no obvious distress.  Vital signs:  Temperature of 97.7, pulse 71, respiratory rate 18, blood pressure is 130/79.  Weight is 202. Head/neck:  Normocephalic, atraumatic skull.  There are no ocular or oral lesions.  There are no palpable cervical or supraclavicular lymph nodes.  Lungs:  Clear bilaterally.  Cardiac:  Regular rate and rhythm with a normal S1, S2.  There are no murmurs, rubs or bruits.  Abdomen: Soft with good bowel sounds.  There is no palpable abdominal mass. There is no fluid wave.  There is no palpable hepatosplenomegaly.  Back: No tenderness over the spine, ribs, or hips.  Extremities:  No clubbing, cyanosis or edema.  Skin:  No rashes, ecchymoses or petechia. Neurologic:  No focal neurological deficits.  LABORATORY STUDIES:  White cell count 4.5, hemoglobin 15.1, hematocrit 43.2, platelet count 222.  MCV is 98.  IMPRESSION:  Austin Summers is a 52 year old gentleman with hemochromatosis. So far, he has done incredibly well.  He has had no complications from the hemochromatosis.  We will go ahead and see what his ferritin is.  Again, his ferritin when we last checked was 41  back in October.  We think it would be a little unusual for it to go up too quickly, but one never knows.  We will continue to follow him every 3 months for his iron studies.  We will see what his ultrasound shows.  I will see him back in 6 months unless any problems arise before then.    ______________________________ [name], M.D. PRE/MEDQ  D:  11/17/2012  T:  11/18/2012  Job:  [id number]

## 2012-11-20 ENCOUNTER — Telehealth: Payer: Self-pay | Admitting: *Deleted

## 2012-11-20 ENCOUNTER — Ambulatory Visit (HOSPITAL_BASED_OUTPATIENT_CLINIC_OR_DEPARTMENT_OTHER)
Admission: RE | Admit: 2012-11-20 | Discharge: 2012-11-20 | Disposition: A | Discharge status: Home / Self Care | Payer: BC Managed Care – PPO | Source: Ambulatory Visit | Attending: Hematology & Oncology | Admitting: Hematology & Oncology

## 2012-11-20 DIAGNOSIS — R1011 Right upper quadrant pain: Secondary | ICD-10-CM | POA: Insufficient documentation

## 2012-11-20 NOTE — Telephone Encounter (Signed)
Called patient to let him know that the ultrasound of liver and gallbladder was normal per dr. Myna Hidalgo.  Left message on personal cell phone answering machine

## 2012-11-20 NOTE — Telephone Encounter (Signed)
Message copied by Anselm Jungling on Mon Nov 20, 2012  3:26 PM ------      Message from: Arlan Organ R      Created: Mon Nov 20, 2012  2:56 PM       Call - [place] of liver and gallbladder is normal!  Cindee Lame ------

## 2013-02-16 ENCOUNTER — Other Ambulatory Visit (HOSPITAL_BASED_OUTPATIENT_CLINIC_OR_DEPARTMENT_OTHER): Payer: BC Managed Care – PPO | Admitting: Lab

## 2013-02-16 LAB — CBC WITH DIFFERENTIAL (CANCER CENTER ONLY)
BASO#: 0 10*3/uL (ref 0.0–0.2)
EOS%: 3.9 % (ref 0.0–7.0)
Eosinophils Absolute: 0.2 10*3/uL (ref 0.0–0.5)
HCT: 45.9 % (ref 38.7–49.9)
HGB: 15.9 g/dL (ref 13.0–17.1)
LYMPH#: 2 10*3/uL (ref 0.9–3.3)
MCH: 34.6 pg — ABNORMAL HIGH (ref 28.0–33.4)
MCHC: 34.6 g/dL (ref 32.0–35.9)
MONO%: 10.9 % (ref 0.0–13.0)
NEUT%: 47.7 % (ref 40.0–80.0)
RBC: 4.6 10*6/uL (ref 4.20–5.70)

## 2013-02-16 LAB — IRON AND TIBC
%SAT: 94 % — ABNORMAL HIGH (ref 20–55)
UIBC: 18 ug/dL — ABNORMAL LOW (ref 125–400)

## 2013-02-22 ENCOUNTER — Other Ambulatory Visit: Payer: Self-pay | Admitting: *Deleted

## 2013-02-22 ENCOUNTER — Telehealth: Payer: Self-pay | Admitting: *Deleted

## 2013-02-22 NOTE — Telephone Encounter (Signed)
Message copied by Anselm Jungling on Thu Feb 22, 2013 12:20 PM ------      Message from: [name]      Created: Sun Feb 18, 2013  9:06 PM       Call - iron is too high!! Need phlebotomy q wk x 2 weeks!!!  Please set up!!  Pete ------

## 2013-02-22 NOTE — Telephone Encounter (Signed)
Called patient to let him know his iron levels are too high needs 2 phlebotomies one week apart per dr. Myna Hidalgo

## 2013-02-22 NOTE — Telephone Encounter (Signed)
Pt aware of 5-16 and 23 phlebotomies.

## 2013-02-23 ENCOUNTER — Ambulatory Visit (HOSPITAL_BASED_OUTPATIENT_CLINIC_OR_DEPARTMENT_OTHER): Payer: BC Managed Care – PPO

## 2013-02-23 NOTE — Patient Instructions (Signed)

## 2013-02-23 NOTE — Progress Notes (Signed)
Austin Summers presents today for phlebotomy per MD orders. Phlebotomy procedure started at 1426 and ended at 1435. Approximately removed. Patient tolerated procedure well. IV needle removed intact.

## 2013-03-01 ENCOUNTER — Ambulatory Visit (HOSPITAL_BASED_OUTPATIENT_CLINIC_OR_DEPARTMENT_OTHER): Payer: BC Managed Care – PPO

## 2013-03-01 NOTE — Patient Instructions (Addendum)
Therapeutic Phlebotomy Therapeutic phlebotomy is the controlled removal of blood from your body for the purpose of treating a medical condition. It is similar to donating blood. Usually, about a pint (470 mL) of blood is removed. The average adult has 9 to 12 pints (4.3 to 5.7 L) of blood. Therapeutic phlebotomy may be used to treat the following medical conditions:  Hemochromatosis. This is a condition in which there is too much iron in the blood.  Polycythemia vera. This is a condition in which there are too many red cells in the blood.  Porphyria cutanea tarda. This is a disease usually passed from one generation to the next (inherited). It is a condition in which an important part of hemoglobin is not made properly. This results in the build up of abnormal amounts of porphyrins in the body.  Sickle cell disease. This is an inherited disease. It is a condition in which the red blood cells form an abnormal crescent shape rather than a round shape. LET YOUR CAREGIVER KNOW ABOUT:  Allergies.  Medicines taken including herbs, eyedrops, over-the-counter medicines, and creams.  Use of steroids (by mouth or creams).  Previous problems with anesthetics or numbing medicine.  History of blood clots.  History of bleeding or blood problems.  Previous surgery.  Possibility of pregnancy, if this applies. RISKS AND COMPLICATIONS This is a simple and safe procedure. Problems are unlikely. However, problems can occur and may include:  Nausea or lightheadedness.  Low blood pressure.  Soreness, bleeding, swelling, or bruising at the needle insertion site.  Infection. BEFORE THE PROCEDURE  This is a procedure that can be done as an outpatient. Confirm the time that you need to arrive for your procedure. Confirm whether there is a need to fast or withhold any medications. It is helpful to wear clothing with sleeves that can be raised above the elbow. A blood sample may be done to determine the  amount of red blood cells or iron in your blood. Plan ahead of time to have someone drive you home after the procedure. PROCEDURE The entire procedure from preparation through recovery takes about 1 hour. The actual collection takes about 10 to 15 minutes.  A needle will be inserted into your vein.  Tubing and a collection bag will be attached to that needle.  Blood will flow through the needle and tubing into the collection bag.  You may be asked to open and close your hand slowly and continuously during the entire collection.  Once the specified amount of blood has been removed from your body, the collection bag and tubing will be clamped.  The needle will be removed.  Pressure will be held on the site of the needle insertion to stop the bleeding. Then a bandage will be placed over the needle insertion site. AFTER THE PROCEDURE  Your recovery will be assessed and monitored. If there are no problems, as an outpatient, you should be able to go home shortly after the procedure.  Document Released: 03/01/2011 Document Revised: 12/20/2011 Document Reviewed: 03/01/2011 ExitCare Patient Information 2014 [employer].  

## 2013-03-01 NOTE — Progress Notes (Signed)
Austin Summers presents today for phlebotomy per MD orders. Phlebotomy procedure started at 1330 and ended at 1335. 500 grams removed. Patient observed for 30 minutes after procedure without any incident. Patient tolerated procedure well. IV needle removed intact.

## 2013-05-18 ENCOUNTER — Telehealth: Payer: Self-pay | Admitting: Hematology & Oncology

## 2013-05-18 ENCOUNTER — Ambulatory Visit (HOSPITAL_BASED_OUTPATIENT_CLINIC_OR_DEPARTMENT_OTHER): Payer: 59 | Admitting: Hematology & Oncology

## 2013-05-18 ENCOUNTER — Other Ambulatory Visit (HOSPITAL_BASED_OUTPATIENT_CLINIC_OR_DEPARTMENT_OTHER): Payer: 59 | Admitting: Lab

## 2013-05-18 LAB — CBC WITH DIFFERENTIAL (CANCER CENTER ONLY)
BASO#: 0 10*3/uL (ref 0.0–0.2)
Eosinophils Absolute: 0.2 10*3/uL (ref 0.0–0.5)
HCT: 44.9 % (ref 38.7–49.9)
HGB: 15.4 g/dL (ref 13.0–17.1)
LYMPH%: 38.5 % (ref 14.0–48.0)
MCH: 34.5 pg — ABNORMAL HIGH (ref 28.0–33.4)
MCV: 100 fL — ABNORMAL HIGH (ref 82–98)
MONO%: 13.4 % — ABNORMAL HIGH (ref 0.0–13.0)
RBC: 4.47 10*6/uL (ref 4.20–5.70)

## 2013-05-18 NOTE — Progress Notes (Signed)
This office note has been dictated.

## 2013-05-18 NOTE — Telephone Encounter (Signed)
In basket order for 3 month lab, would be 11-3 pt is going to be out of country scheduled 11-17 lab

## 2013-05-19 NOTE — Progress Notes (Signed)
CC:   Bryan Lemma. Manus Gunning, M.D.  DIAGNOSIS:  Hemochromatosis (homozygous mutation for C282Y).  CURRENT THERAPY:  Phlebotomy to maintain ferritin less than 100.  INTERIM HISTORY:  Mr. Austin Summers comes in for his followup.  As always, he has been traveling.  He just got back from [place].  He goes there pretty much every 2 or 3 months.  We last checked his ferritin in May.  It was 128.  He was phlebotomized.  He has had no problems with nausea or vomiting.  There has been no abdominal pain.  He has gained some weight.  He is trying to work out more.  He has had no rashes.  There has been no leg swelling.  There has been no cough or shortness breath.  PHYSICAL EXAM:  General:  This is a well-developed, well-nourished white gentleman in no obvious distress.  Vital Signs:  Show a temperature of 98.1, pulse 65, respiratory rate 18, blood pressure 140/91.  Weight is 211 pounds.  Head and Neck:  Show a normocephalic, atraumatic skull. There are no ocular or oral lesions.  There are no palpable cervical or supraclavicular lymph nodes.  Lungs:  Clear bilaterally.  Cardiac: Regular rate and rhythm with a normal S1, S2.  There are no murmurs, rubs, or bruits.  Abdomen:  Soft.  He has good bowel sounds.  There is no fluid wave.  There is no palpable hepatosplenomegaly.  Extremities: Show no clubbing, cyanosis, or edema.  He has no swelling in the joints. Neurological:  Shows no focal neurological deficits.  LABORATORY STUDIES:  White cell count is 4.9.  Hemoglobin 15.4, hematocrit 44.9, platelet count 187.  IMPRESSION:  Mr. Austin Summers is a 52 year old gentleman with hemochromatosis. Again, he is homozygous for the C282Y mutation.  We have him coming every 3 months for lab work.  I will see him back in 6 months.  We did do an ultrasound on him.  This was done back in February. Everything looked fine with respect to his liver and spleen.  There were no  gallstones.    ______________________________ [name], M.D. PRE/MEDQ  D:  05/18/2013  T:  05/19/2013  Job:  [id number]

## 2013-06-15 ENCOUNTER — Ambulatory Visit
Admission: RE | Admit: 2013-06-15 | Discharge: 2013-06-15 | Disposition: A | Discharge status: Home / Self Care | Payer: 59 | Source: Ambulatory Visit | Attending: Family Medicine | Admitting: Family Medicine

## 2013-06-15 ENCOUNTER — Other Ambulatory Visit: Payer: Self-pay | Admitting: Family Medicine

## 2013-06-15 DIAGNOSIS — G4482 Headache associated with sexual activity: Secondary | ICD-10-CM

## 2013-06-15 DIAGNOSIS — S066X9A Traumatic subarachnoid hemorrhage with loss of consciousness of unspecified duration, initial encounter: Secondary | ICD-10-CM

## 2013-06-19 ENCOUNTER — Other Ambulatory Visit: Payer: Self-pay | Admitting: Family Medicine

## 2013-06-19 DIAGNOSIS — G4482 Headache associated with sexual activity: Secondary | ICD-10-CM

## 2013-06-20 ENCOUNTER — Ambulatory Visit
Admission: RE | Admit: 2013-06-20 | Discharge: 2013-06-20 | Disposition: A | Discharge status: Home / Self Care | Payer: 59 | Source: Ambulatory Visit | Attending: Family Medicine | Admitting: Family Medicine

## 2013-06-20 DIAGNOSIS — G4482 Headache associated with sexual activity: Secondary | ICD-10-CM

## 2013-07-16 ENCOUNTER — Telehealth: Payer: Self-pay | Admitting: Hematology & Oncology

## 2013-07-16 NOTE — Telephone Encounter (Signed)
Patient called and cx 08/27/13 and resch for 08/31/13

## 2013-08-27 ENCOUNTER — Other Ambulatory Visit: Payer: 59 | Admitting: Lab

## 2013-08-30 ENCOUNTER — Telehealth: Payer: Self-pay | Admitting: Hematology & Oncology

## 2013-08-30 NOTE — Telephone Encounter (Signed)
Pt called cx 11-21 said he would call back to reschedule. Left RN voice mail

## 2013-08-31 ENCOUNTER — Other Ambulatory Visit: Payer: 59 | Admitting: Lab

## 2013-10-15 ENCOUNTER — Other Ambulatory Visit (HOSPITAL_BASED_OUTPATIENT_CLINIC_OR_DEPARTMENT_OTHER): Payer: BC Managed Care – PPO | Admitting: Lab

## 2013-10-15 LAB — CBC WITH DIFFERENTIAL (CANCER CENTER ONLY)
BASO#: 0 10*3/uL (ref 0.0–0.2)
BASO%: 0.3 % (ref 0.0–2.0)
EOS%: 1.7 % (ref 0.0–7.0)
Eosinophils Absolute: 0.1 10*3/uL (ref 0.0–0.5)
HEMATOCRIT: 44.1 % (ref 38.7–49.9)
HEMOGLOBIN: 14.8 g/dL (ref 13.0–17.1)
LYMPH#: 1.3 10*3/uL (ref 0.9–3.3)
LYMPH%: 21.6 % (ref 14.0–48.0)
MCH: 33.6 pg — ABNORMAL HIGH (ref 28.0–33.4)
MCHC: 33.6 g/dL (ref 32.0–35.9)
MCV: 100 fL — ABNORMAL HIGH (ref 82–98)
MONO#: 0.5 10*3/uL (ref 0.1–0.9)
MONO%: 8.1 % (ref 0.0–13.0)
NEUT%: 68.3 % (ref 40.0–80.0)
NEUTROS ABS: 4 10*3/uL (ref 1.5–6.5)
Platelets: 213 10*3/uL (ref 145–400)
RBC: 4.41 10*6/uL (ref 4.20–5.70)
RDW: 11.6 % (ref 11.1–15.7)
WBC: 5.8 10*3/uL (ref 4.0–10.0)

## 2013-11-19 ENCOUNTER — Ambulatory Visit (HOSPITAL_BASED_OUTPATIENT_CLINIC_OR_DEPARTMENT_OTHER): Payer: BC Managed Care – PPO | Admitting: Hematology & Oncology

## 2013-11-19 ENCOUNTER — Encounter: Payer: Self-pay | Admitting: Hematology & Oncology

## 2013-11-19 ENCOUNTER — Other Ambulatory Visit (HOSPITAL_BASED_OUTPATIENT_CLINIC_OR_DEPARTMENT_OTHER): Payer: BC Managed Care – PPO | Admitting: Lab

## 2013-11-19 LAB — CBC WITH DIFFERENTIAL (CANCER CENTER ONLY)
BASO#: 0 10*3/uL (ref 0.0–0.2)
BASO%: 0.3 % (ref 0.0–2.0)
EOS ABS: 0.1 10*3/uL (ref 0.0–0.5)
EOS%: 3.6 % (ref 0.0–7.0)
HEMATOCRIT: 43 % (ref 38.7–49.9)
HEMOGLOBIN: 14.5 g/dL (ref 13.0–17.1)
LYMPH#: 1.6 10*3/uL (ref 0.9–3.3)
LYMPH%: 41.2 % (ref 14.0–48.0)
MCH: 33.4 pg (ref 28.0–33.4)
MCHC: 33.7 g/dL (ref 32.0–35.9)
MCV: 99 fL — ABNORMAL HIGH (ref 82–98)
MONO#: 0.4 10*3/uL (ref 0.1–0.9)
MONO%: 9.8 % (ref 0.0–13.0)
NEUT%: 45.1 % (ref 40.0–80.0)
NEUTROS ABS: 1.7 10*3/uL (ref 1.5–6.5)
Platelets: 216 10*3/uL (ref 145–400)
RBC: 4.34 10*6/uL (ref 4.20–5.70)
RDW: 12 % (ref 11.1–15.7)
WBC: 3.9 10*3/uL — ABNORMAL LOW (ref 4.0–10.0)

## 2013-11-19 LAB — IRON AND TIBC CHCC
%SAT: 63 % — AB (ref 20–55)
Iron: 164 ug/dL — ABNORMAL HIGH (ref 42–163)
TIBC: 260 ug/dL (ref 202–409)
UIBC: 96 ug/dL — AB (ref 117–376)

## 2013-11-19 LAB — FERRITIN CHCC: FERRITIN: 98 ng/mL (ref 22–316)

## 2013-11-19 NOTE — Progress Notes (Signed)
Hematology and Oncology Follow Up Visit  Austin Summers [id number] Apr 11, 1961 53 y.o. 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  Current Therapy:    Phlebotomy to maintain ferritin less than 100.     Interim History:  Mr.. Tiburcio Summers is back for followup. We last saw him back in August of. The left phlebotomies back in June. At that point on, his ferritin was 128.. Doing okay. Unfortunately, his mother has Alzheimer's disease. He is busy try to help with her. She lives in [place].  He's had no issues with cough or shortness of breath. He did have an MR I an MRI of the brain in September. These were negative.. As take occasional shortness of breath. This does not seeing happen with activity.  He's had no rashes. There's been no change in medications. He's had no change in bowel or bladder habits. Medications: Current outpatient prescriptions:aspirin 81 MG tablet, Take 81 mg by mouth daily., Disp: , Rfl:   Allergies:  Allergies  Allergen Reactions  . Other Shortness Of Breath    BEES  . Penicillins Other (See Comments)    Had as child does not know reaction.    Past Medical History, Surgical history, Social history, and Family History were reviewed and updated.  Review of Systems: As above  Physical Exam:  height is 6' (1.829 m) and weight is 209 lb (94.802 kg). His oral temperature is 97.8 F (36.6 C). His blood pressure is 128/82 and his pulse is 73. His respiration is 18.   He is well-developed well-nourished. His vital signs are as above. Head in exam shows no ocular or oral lesions. He is a palpable cervical supraclavicular let us. Lungs are clear by laterally. Cardiac exam regular rate and rhythm with a normal S1-S2. There are no murmurs rubs or bruits. Abdomen is soft. Has good bowel sounds. There is no fluid wave. There is a palpable hepatospleno megaly back exam no tenderness of the spine ribs or hips. Extremities shows no clubbing  cyanosis or edema. Has good strength in his legs. Skin exam no rashes. Neurological exam no focal neurological deficits.  Lab Results  Component Value Date   WBC 3.9* 11/19/2013   HGB 14.5 11/19/2013   HCT 43.0 11/19/2013   MCV 99* 11/19/2013   PLT 216 11/19/2013     Chemistry      Component Value Date/Time   NA 137 10/21/2011 0859   NA 137 10/21/2011 0859   NA 137 10/21/2011 0859   K 4.5 10/21/2011 0859   K 4.5 10/21/2011 0859   K 4.5 10/21/2011 0859   CL 103 10/21/2011 0859   CL 103 10/21/2011 0859   CL 103 10/21/2011 0859   CO2 22 10/21/2011 0859   CO2 22 10/21/2011 0859   CO2 22 10/21/2011 0859   BUN 18 10/21/2011 0859   BUN 18 10/21/2011 0859   BUN 18 10/21/2011 0859   CREATININE 0.94 10/21/2011 0859   CREATININE 0.94 10/21/2011 0859   CREATININE 0.94 10/21/2011 0859      Component Value Date/Time   CALCIUM 9.4 10/21/2011 0859   CALCIUM 9.4 10/21/2011 0859   CALCIUM 9.4 10/21/2011 0859   ALKPHOS 67 10/21/2011 0859   ALKPHOS 67 10/21/2011 0859   ALKPHOS 67 10/21/2011 0859   AST 26 10/21/2011 0859   AST 26 10/21/2011 0859   AST 26 10/21/2011 0859   ALT 31 10/21/2011 0859   ALT 31 10/21/2011 0859   ALT 31 10/21/2011 0859  BILITOT 1.0 10/21/2011 0859   BILITOT 1.0 10/21/2011 0859   BILITOT 1.0 10/21/2011 0859         Impression and Plan: Austin Summers is a 53 year old gentleman. She has hemochromatosis. He is homozygous for the C282Y mutation.  I suspect it would probably will need to phlebotomize him. We will see what his ferritin level is.  He comes back every 3 months for lab work. I'll see him back in 6 months.     Austin Macho, MD 2/9/20159:53 AM

## 2013-11-21 ENCOUNTER — Telehealth: Payer: Self-pay | Admitting: Hematology & Oncology

## 2013-11-21 ENCOUNTER — Telehealth: Payer: Self-pay | Admitting: *Deleted

## 2013-11-21 NOTE — Telephone Encounter (Signed)
Left pt message to call and schedule phlebotomy 

## 2013-11-21 NOTE — Telephone Encounter (Signed)
Pt aware of 2-16 appointment °

## 2013-11-21 NOTE — Telephone Encounter (Signed)
Message copied by Anselm JunglingBARTKO, Jashira Cotugno ELLEN O on Wed Nov 21, 2013  1:39 PM ------      Message from: Josph MachoENNEVER, PETER R      Created: Tue Nov 20, 2013  2:56 PM       Call ferritin is 98 -- this is close enough to 100 that we need to phlebotomize 1 unit of blood.  Please set this up!  Pete ------

## 2013-11-21 NOTE — Telephone Encounter (Signed)
Called patient to tell him his ferritin is 98 close enough to 100 that we need to phlebotomize 1 unit of blood.  Left message for scheduler

## 2013-11-26 ENCOUNTER — Ambulatory Visit (HOSPITAL_BASED_OUTPATIENT_CLINIC_OR_DEPARTMENT_OTHER): Payer: BC Managed Care – PPO

## 2013-11-26 ENCOUNTER — Other Ambulatory Visit: Payer: Self-pay

## 2013-11-26 NOTE — Patient Instructions (Signed)

## 2013-11-26 NOTE — Progress Notes (Signed)
Austin Summers presents today for phlebotomy per MD orders. Phlebotomy procedure started at 1425 and ended at 1430. 500 ml  removed. Patient observed for 30 minutes after procedure without any incident. Patient tolerated procedure well. IV needle removed intact.

## 2014-02-11 ENCOUNTER — Other Ambulatory Visit (HOSPITAL_BASED_OUTPATIENT_CLINIC_OR_DEPARTMENT_OTHER): Payer: BC Managed Care – PPO | Admitting: Lab

## 2014-02-11 ENCOUNTER — Other Ambulatory Visit: Payer: BC Managed Care – PPO | Admitting: Lab

## 2014-02-11 LAB — CBC WITH DIFFERENTIAL (CANCER CENTER ONLY)
BASO#: 0 10*3/uL (ref 0.0–0.2)
BASO%: 0.4 % (ref 0.0–2.0)
EOS%: 2.8 % (ref 0.0–7.0)
Eosinophils Absolute: 0.1 10*3/uL (ref 0.0–0.5)
HCT: 45.1 % (ref 38.7–49.9)
HGB: 15.6 g/dL (ref 13.0–17.1)
LYMPH#: 1.5 10*3/uL (ref 0.9–3.3)
LYMPH%: 33.6 % (ref 14.0–48.0)
MCH: 34.4 pg — AB (ref 28.0–33.4)
MCHC: 34.6 g/dL (ref 32.0–35.9)
MCV: 100 fL — ABNORMAL HIGH (ref 82–98)
MONO#: 0.4 10*3/uL (ref 0.1–0.9)
MONO%: 9.6 % (ref 0.0–13.0)
NEUT#: 2.5 10*3/uL (ref 1.5–6.5)
NEUT%: 53.6 % (ref 40.0–80.0)
PLATELETS: 199 10*3/uL (ref 145–400)
RBC: 4.53 10*6/uL (ref 4.20–5.70)
RDW: 11.3 % (ref 11.1–15.7)
WBC: 4.6 10*3/uL (ref 4.0–10.0)

## 2014-02-11 LAB — FERRITIN CHCC: Ferritin: 59 ng/ml (ref 22–316)

## 2014-02-11 LAB — IRON AND TIBC CHCC
%SAT: 56 % — ABNORMAL HIGH (ref 20–55)
Iron: 158 ug/dL (ref 42–163)
TIBC: 281 ug/dL (ref 202–409)
UIBC: 124 ug/dL (ref 117–376)

## 2014-02-13 ENCOUNTER — Telehealth: Payer: Self-pay | Admitting: *Deleted

## 2014-02-13 NOTE — Telephone Encounter (Addendum)
Message copied by Burnett CorrenteUMLEY, Aamir Mclinden L on Wed Feb 13, 2014 12:16 PM ------      Message from: Arlan OrganENNEVER, PETER R      Created: Mon Feb 11, 2014  6:40 PM       Please call and let him know that his ferritin is only 59. This is fantastic. He does not need to be phlebotomized. Pete ------Informed pt that ferritin is 59 and he doesn't have to be phlebotomized.

## 2014-05-20 ENCOUNTER — Encounter: Payer: Self-pay | Admitting: Hematology & Oncology

## 2014-05-20 ENCOUNTER — Other Ambulatory Visit (HOSPITAL_BASED_OUTPATIENT_CLINIC_OR_DEPARTMENT_OTHER): Payer: BC Managed Care – PPO | Admitting: Lab

## 2014-05-20 ENCOUNTER — Ambulatory Visit (HOSPITAL_BASED_OUTPATIENT_CLINIC_OR_DEPARTMENT_OTHER): Payer: BC Managed Care – PPO | Admitting: Hematology & Oncology

## 2014-05-20 LAB — CBC WITH DIFFERENTIAL (CANCER CENTER ONLY)
BASO#: 0 10*3/uL (ref 0.0–0.2)
BASO%: 0.6 % (ref 0.0–2.0)
EOS ABS: 0.1 10*3/uL (ref 0.0–0.5)
EOS%: 3.6 % (ref 0.0–7.0)
HEMATOCRIT: 44.3 % (ref 38.7–49.9)
HGB: 15.2 g/dL (ref 13.0–17.1)
LYMPH#: 1.7 10*3/uL (ref 0.9–3.3)
LYMPH%: 46 % (ref 14.0–48.0)
MCH: 34.3 pg — AB (ref 28.0–33.4)
MCHC: 34.3 g/dL (ref 32.0–35.9)
MCV: 100 fL — AB (ref 82–98)
MONO#: 0.4 10*3/uL (ref 0.1–0.9)
MONO%: 9.9 % (ref 0.0–13.0)
NEUT#: 1.5 10*3/uL (ref 1.5–6.5)
NEUT%: 39.9 % — ABNORMAL LOW (ref 40.0–80.0)
PLATELETS: 231 10*3/uL (ref 145–400)
RBC: 4.43 10*6/uL (ref 4.20–5.70)
RDW: 11.8 % (ref 11.1–15.7)
WBC: 3.6 10*3/uL — AB (ref 4.0–10.0)

## 2014-05-20 NOTE — Progress Notes (Signed)
Hematology and Oncology Follow Up Visit  Austin HockMichael Summers [id number] 1961/04/25 53 y.o. 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  Current Therapy:    Lobotomy to maintain ferritin less than 100     Interim History:  Mr.  Austin Summers is back for followup. Last saw him back in there were. Since then, he has gotten married. He went to [place] recently. Had a good time with he and his new wife. He really enjoyed himself with his new wife.  He's working without problems. Thankfully, he decided travel that much he used to go to [place] all the time. However, because of all of his trips to [place], he got first-class upgrade to why and patent nothing.  He gets phlebotomized probably once every 658 months.  His last ferritin that we checked about 3 months ago was 59.  He's had no problems with headache. He is has a problem with his knees. He does not see an orthopedist.  Medications: Current outpatient prescriptions:aspirin 81 MG tablet, Take 81 mg by mouth daily., Disp: , Rfl: ;  Melatonin 2.5 MG CAPS, Take by mouth as needed., Disp: , Rfl:   Allergies:  Allergies  Allergen Reactions  . Other Shortness Of Breath    BEES  . Penicillins Other (See Comments)    Had as child does not know reaction.    Past Medical History, Surgical history, Social history, and Family History were reviewed and updated.  Review of Systems: As above  Physical Exam:  height is 5\' 9"  (1.753 m) and weight is 203 lb (92.08 kg). His oral temperature is 98 F (36.7 C). His blood pressure is 114/75 and his pulse is 85. His respiration is 18.   Well-developed and well-nourished white 7. Head and neck exam shows no ocular or oral lesions. He has no palpable cervical or supraclavicular lymph nodes. Lungs are clear bilateral. Cardiac exam regular in rhythm with no murmurs rubs or bruits. Abdomen is soft. Has good bowel sounds. There is no fluid wave. There is no palpable liver or  spleen tip. Back exam shows no tenderness over the spine ribs or hips. Extremities shows no clubbing cyanosis or edema. Neurological exam shows no focal neurological deficits.  Lab Results  Component Value Date   WBC 3.6* 05/20/2014   HGB 15.2 05/20/2014   HCT 44.3 05/20/2014   MCV 100* 05/20/2014   PLT 231 05/20/2014     Chemistry      Component Value Date/Time   NA 137 10/21/2011 0859   NA 137 10/21/2011 0859   NA 137 10/21/2011 0859   K 4.5 10/21/2011 0859   K 4.5 10/21/2011 0859   K 4.5 10/21/2011 0859   CL 103 10/21/2011 0859   CL 103 10/21/2011 0859   CL 103 10/21/2011 0859   CO2 22 10/21/2011 0859   CO2 22 10/21/2011 0859   CO2 22 10/21/2011 0859   BUN 18 10/21/2011 0859   BUN 18 10/21/2011 0859   BUN 18 10/21/2011 0859   CREATININE 0.94 10/21/2011 0859   CREATININE 0.94 10/21/2011 0859   CREATININE 0.94 10/21/2011 0859      Component Value Date/Time   CALCIUM 9.4 10/21/2011 0859   CALCIUM 9.4 10/21/2011 0859   CALCIUM 9.4 10/21/2011 0859   ALKPHOS 67 10/21/2011 0859   ALKPHOS 67 10/21/2011 0859   ALKPHOS 67 10/21/2011 0859   AST 26 10/21/2011 0859   AST 26 10/21/2011 0859   AST 26 10/21/2011 0859   ALT  31 10/21/2011 0859   ALT 31 10/21/2011 0859   ALT 31 10/21/2011 0859   BILITOT 1.0 10/21/2011 0859   BILITOT 1.0 10/21/2011 0859   BILITOT 1.0 10/21/2011 0859         Impression and Plan: Austin Summers is 53 year old gentleman with hemochromatosis. He is homozygous for the major mutation. He has done incredibly well. He's had no palpitations from the hemachromatosis. We are to keep his iron levels low so that he cannot have iron accumulation.  We will continue him on his every 3 month blood check.  I'll see him back in 6 months.   Austin Macho, MD 8/10/201511:25 AM

## 2014-05-21 LAB — IRON AND TIBC
%SAT: 54 % (ref 20–55)
Iron: 151 ug/dL (ref 42–165)
TIBC: 279 ug/dL (ref 215–435)
UIBC: 128 ug/dL (ref 125–400)

## 2014-05-21 LAB — FERRITIN: Ferritin: 110 ng/mL (ref 22–322)

## 2014-05-22 ENCOUNTER — Telehealth: Payer: Self-pay | Admitting: *Deleted

## 2014-05-22 NOTE — Telephone Encounter (Addendum)
Message copied by Burnett CorrenteUMLEY, Amandine Covino L on Wed May 22, 2014  9:15 AM ------      Message from: Arlan OrganENNEVER, PETER R      Created: Tue May 21, 2014 10:20 PM       Call - ferrtin is too high!!  Need to phlebotomize one time!!  Please set up!!  pete ------Informed pt that ferrtin is too high!! Need to phlebotomize one time. Pt set up appt with the scheduler.

## 2014-05-24 ENCOUNTER — Ambulatory Visit (HOSPITAL_BASED_OUTPATIENT_CLINIC_OR_DEPARTMENT_OTHER): Payer: BC Managed Care – PPO

## 2014-05-24 NOTE — Patient Instructions (Signed)

## 2014-05-24 NOTE — Progress Notes (Signed)
Austin HockMichael Decarolis presents today for phlebotomy per MD orders. Phlebotomy procedure started at 1342 and ended at 1350. 500mL removed. Patient observed for 30 minutes after procedure without any incident. Patient tolerated procedure well. IV needle removed intact.

## 2014-08-19 ENCOUNTER — Other Ambulatory Visit (HOSPITAL_BASED_OUTPATIENT_CLINIC_OR_DEPARTMENT_OTHER): Payer: BC Managed Care – PPO | Admitting: Lab

## 2014-08-19 LAB — COMPREHENSIVE METABOLIC PANEL
ALBUMIN: 4.2 g/dL (ref 3.5–5.2)
ALK PHOS: 74 U/L (ref 39–117)
ALT: 32 U/L (ref 0–53)
AST: 25 U/L (ref 0–37)
BUN: 9 mg/dL (ref 6–23)
CO2: 27 mEq/L (ref 19–32)
Calcium: 9.1 mg/dL (ref 8.4–10.5)
Chloride: 106 mEq/L (ref 96–112)
Creatinine, Ser: 0.93 mg/dL (ref 0.50–1.35)
Glucose, Bld: 84 mg/dL (ref 70–99)
POTASSIUM: 4.7 meq/L (ref 3.5–5.3)
SODIUM: 141 meq/L (ref 135–145)
TOTAL PROTEIN: 8 g/dL (ref 6.0–8.3)
Total Bilirubin: 0.5 mg/dL (ref 0.2–1.2)

## 2014-08-19 LAB — CBC WITH DIFFERENTIAL (CANCER CENTER ONLY)
BASO#: 0 10*3/uL (ref 0.0–0.2)
BASO%: 0.1 % (ref 0.0–2.0)
EOS%: 2.5 % (ref 0.0–7.0)
Eosinophils Absolute: 0.2 10*3/uL (ref 0.0–0.5)
HEMATOCRIT: 45 % (ref 38.7–49.9)
HGB: 15.5 g/dL (ref 13.0–17.1)
LYMPH#: 1.9 10*3/uL (ref 0.9–3.3)
LYMPH%: 27.9 % (ref 14.0–48.0)
MCH: 34.2 pg — ABNORMAL HIGH (ref 28.0–33.4)
MCHC: 34.4 g/dL (ref 32.0–35.9)
MCV: 99 fL — ABNORMAL HIGH (ref 82–98)
MONO#: 0.5 10*3/uL (ref 0.1–0.9)
MONO%: 7.6 % (ref 0.0–13.0)
NEUT#: 4.2 10*3/uL (ref 1.5–6.5)
NEUT%: 61.9 % (ref 40.0–80.0)
PLATELETS: 240 10*3/uL (ref 145–400)
RBC: 4.53 10*6/uL (ref 4.20–5.70)
RDW: 11.6 % (ref 11.1–15.7)
WBC: 6.7 10*3/uL (ref 4.0–10.0)

## 2014-08-19 LAB — IRON AND TIBC CHCC
%SAT: 59 % — ABNORMAL HIGH (ref 20–55)
IRON: 162 ug/dL (ref 42–163)
TIBC: 274 ug/dL (ref 202–409)
UIBC: 112 ug/dL — AB (ref 117–376)

## 2014-08-19 LAB — FERRITIN CHCC: Ferritin: 76 ng/ml (ref 22–316)

## 2014-08-20 ENCOUNTER — Telehealth: Payer: Self-pay | Admitting: *Deleted

## 2014-08-20 NOTE — Telephone Encounter (Addendum)
-----   Message from Josph MachoPeter R Ennever, MD sent at 08/19/2014  4:11 PM EST ----- Please call and let him know that the ferritin is 76. We do not have to phlebotomize him. Pete - Informed pt that ferritin is 76 and there is no need for him to be phlebotomized right now.

## 2014-11-18 ENCOUNTER — Telehealth: Payer: Self-pay

## 2014-11-18 ENCOUNTER — Encounter: Payer: Self-pay | Admitting: Family

## 2014-11-18 ENCOUNTER — Ambulatory Visit (HOSPITAL_BASED_OUTPATIENT_CLINIC_OR_DEPARTMENT_OTHER): Payer: BLUE CROSS/BLUE SHIELD | Admitting: Family

## 2014-11-18 ENCOUNTER — Other Ambulatory Visit (HOSPITAL_BASED_OUTPATIENT_CLINIC_OR_DEPARTMENT_OTHER): Payer: BLUE CROSS/BLUE SHIELD | Admitting: Lab

## 2014-11-18 LAB — FERRITIN CHCC: Ferritin: 149 ng/ml (ref 22–316)

## 2014-11-18 LAB — CBC WITH DIFFERENTIAL (CANCER CENTER ONLY)
BASO#: 0 10*3/uL (ref 0.0–0.2)
BASO%: 0.3 % (ref 0.0–2.0)
EOS%: 2 % (ref 0.0–7.0)
Eosinophils Absolute: 0.1 10*3/uL (ref 0.0–0.5)
HCT: 43.8 % (ref 38.7–49.9)
HEMOGLOBIN: 15.1 g/dL (ref 13.0–17.1)
LYMPH#: 1.7 10*3/uL (ref 0.9–3.3)
LYMPH%: 24.9 % (ref 14.0–48.0)
MCH: 34.4 pg — ABNORMAL HIGH (ref 28.0–33.4)
MCHC: 34.5 g/dL (ref 32.0–35.9)
MCV: 100 fL — AB (ref 82–98)
MONO#: 0.8 10*3/uL (ref 0.1–0.9)
MONO%: 12.1 % (ref 0.0–13.0)
NEUT#: 4.2 10*3/uL (ref 1.5–6.5)
NEUT%: 60.7 % (ref 40.0–80.0)
Platelets: 226 10*3/uL (ref 145–400)
RBC: 4.39 10*6/uL (ref 4.20–5.70)
RDW: 11.9 % (ref 11.1–15.7)
WBC: 6.9 10*3/uL (ref 4.0–10.0)

## 2014-11-18 LAB — IRON AND TIBC CHCC
%SAT: 55 % (ref 20–55)
IRON: 138 ug/dL (ref 42–163)
TIBC: 251 ug/dL (ref 202–409)
UIBC: 114 ug/dL — ABNORMAL LOW (ref 117–376)

## 2014-11-18 NOTE — Telephone Encounter (Signed)
Dr Gustavo LahEnnever's message left on personalized VM to contact our office to schedule phlebotomies x 3. dph

## 2014-11-18 NOTE — Progress Notes (Signed)
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 Lisbon is here today for a follow-up. He is doing quite well. He has been doing a lot of yoga which he said has really helped strengthen his back.  He denies fever, chills, n/v, cough, rash, dizziness, SOB, chest pain, palpitations, abdominal pain, constipation, diarrhea, blood in urine or stool.  No swelling, tenderness, numbness or tingling in his extremities. No new aches or pains.  His appetite is good and he is staying hydrated. His weight is stable at 209 lbs.   CURRENT TREATMENT: Phlebotomy to maintain ferritin less than 100  REVIEW OF SYSTEMS: All other 10 point review of systems is negative.   PAST MEDICAL HISTORY: Past Medical History  Diagnosis Date  . Hemochromatosis 10/21/2011    PAST SURGICAL HISTORY: History reviewed. No pertinent past surgical history.  FAMILY HISTORY History reviewed. No pertinent family history.  GYNECOLOGIC HISTORY:  No LMP for male patient.   SOCIAL HISTORY: History   Social History  . Marital Status: Divorced    Spouse Name: N/A    Number of Children: N/A  . Years of Education: N/A   Occupational History  . Not on file.   Social History Main Topics  . Smoking status: Never Smoker   . Smokeless tobacco: Former Neurosurgeon    Types: Snuff    Quit date: 11/19/2012     Comment: used snuff for 30 years "off and on"  . Alcohol Use: Not on file  . Drug Use: Not on file  . Sexual Activity: Not on file   Other Topics Concern  . Not on file   Social History Narrative    ADVANCED DIRECTIVES:  <no information>  HEALTH MAINTENANCE: History  Substance Use Topics  . Smoking status: Never Smoker   . Smokeless tobacco: Former Neurosurgeon    Types: Snuff     Quit date: 11/19/2012     Comment: used snuff for 30 years "off and on"  . Alcohol Use: Not on file   Colonoscopy: PAP: Bone density: Lipid panel:  Allergies  Allergen Reactions  . Other Shortness Of Breath    BEES  . Penicillins Other (See Comments)    Had as child does not know reaction.    Current Outpatient Prescriptions  Medication Sig Dispense Refill  . aspirin 81 MG tablet Take 81 mg by mouth daily.    . Melatonin 2.5 MG CAPS Take by mouth as needed.     No current facility-administered medications for this visit.    OBJECTIVE: Filed Vitals:   11/18/14 0926  BP: 145/80  Pulse: 75  Temp: 97.7 F (36.5 C)  Resp: 18    Filed Weights   11/18/14 0926  Weight: 209 lb (94.802 kg)   ECOG FS:0 - Asymptomatic Ocular: Sclerae unicteric, pupils equal, round and reactive to light Ear-nose-throat: Oropharynx clear, dentition fair Lymphatic: No cervical or supraclavicular adenopathy Lungs no rales or rhonchi, good excursion bilaterally Heart regular rate and rhythm, no murmur appreciated Abd soft, nontender, positive bowel sounds MSK no focal spinal tenderness, no joint edema Neuro: non-focal, well-oriented, appropriate affect  LAB RESULTS: CMP     Component Value Date/Time   NA 141 08/19/2014 0919   K 4.7 08/19/2014 0919   CL 106 08/19/2014 0919   CO2 27 08/19/2014 0919  GLUCOSE 84 08/19/2014 0919   BUN 9 08/19/2014 0919   CREATININE 0.93 08/19/2014 0919   CALCIUM 9.1 08/19/2014 0919   PROT 8.0 08/19/2014 0919   ALBUMIN 4.2 08/19/2014 0919   AST 25 08/19/2014 0919   ALT 32 08/19/2014 0919   ALKPHOS 74 08/19/2014 0919   BILITOT 0.5 08/19/2014 0919   INo results found for: SPEP, UPEP Lab Results  Component Value Date   WBC 6.9 11/18/2014   NEUTROABS 4.2 11/18/2014   HGB 15.1 11/18/2014   HCT 43.8 11/18/2014   MCV 100* 11/18/2014   PLT 226 11/18/2014   No results found for: LABCA2 No components found for: LABCA125 No results for input(s): INR  in the last 168 hours. Urinalysis No results found for: COLORURINE, APPEARANCEUR, LABSPEC, PHURINE, GLUCOSEU, HGBUR, BILIRUBINUR, KETONESUR, PROTEINUR, UROBILINOGEN, NITRITE, LEUKOCYTESUR STUDIES: No results found.  ASSESSMENT/PLAN: [name] is 54 year old gentleman with hemochromatosis. He is homozygous for the major mutation. He is doing quite well and is asymptomatic at this time.  His ferritin in November was 76. We will see what his iron studies today show.  We will continue to check his labs every 3 months and see him back in 6 months for labs and follow-up.  He knows to call here with any questions or concerns and to go to th ED in the event of an emergency. We can certainly see him sooner if need be.   Verdie MosherINCINNATI,SARAH M, NP 11/18/2014 10:12 AM

## 2014-11-18 NOTE — Telephone Encounter (Signed)
-----   Message from Josph MachoPeter R Ennever, MD sent at 11/18/2014  1:56 PM EST ----- Call - ferritin is too high -- 149!!  Need to phlebotomize weekly x 3 weeks!!!  plese set up.  pete

## 2014-11-19 ENCOUNTER — Telehealth: Payer: Self-pay | Admitting: Hematology & Oncology

## 2014-11-19 NOTE — Telephone Encounter (Signed)
Pt aware of phlebotomies and MD appointments

## 2014-11-20 ENCOUNTER — Ambulatory Visit (HOSPITAL_BASED_OUTPATIENT_CLINIC_OR_DEPARTMENT_OTHER): Payer: BLUE CROSS/BLUE SHIELD

## 2014-11-20 NOTE — Patient Instructions (Signed)

## 2014-11-20 NOTE — Progress Notes (Signed)
Sherrlyn HockMichael Soller presents today for phlebotomy per MD orders. Phlebotomy procedure started at 1350 and ended at 1358. Approximately 500 mls removed. Patient observed for 30 minutes after procedure without any incident. Patient tolerated procedure well. IV needle removed intact.

## 2014-11-25 ENCOUNTER — Ambulatory Visit (HOSPITAL_BASED_OUTPATIENT_CLINIC_OR_DEPARTMENT_OTHER): Payer: BLUE CROSS/BLUE SHIELD

## 2014-11-25 NOTE — Patient Instructions (Signed)

## 2014-11-25 NOTE — Progress Notes (Signed)
Sherrlyn HockMichael Careaga presents today for phlebotomy per MD orders. Phlebotomy procedure started at 1038 and ended at 1045. 500 ml  removed. Patient observed for 30 minutes after procedure without any incident. Patient tolerated procedure well. IV needle removed intact.

## 2014-12-02 ENCOUNTER — Ambulatory Visit (HOSPITAL_BASED_OUTPATIENT_CLINIC_OR_DEPARTMENT_OTHER): Payer: BLUE CROSS/BLUE SHIELD

## 2014-12-02 NOTE — Progress Notes (Signed)
Austin HockMichael Glas presents today for phlebotomy per MD orders. Phlebotomy procedure started at 1045 and ended at 1051. 500 grams removed. Patient observed for 30 minutes after procedure without any incident. Patient tolerated procedure well. IV needle removed intact.

## 2014-12-02 NOTE — Patient Instructions (Signed)

## 2015-05-16 ENCOUNTER — Other Ambulatory Visit: Payer: Self-pay | Admitting: *Deleted

## 2015-05-19 ENCOUNTER — Other Ambulatory Visit (HOSPITAL_BASED_OUTPATIENT_CLINIC_OR_DEPARTMENT_OTHER): Payer: Managed Care, Other (non HMO)

## 2015-05-19 ENCOUNTER — Ambulatory Visit (HOSPITAL_BASED_OUTPATIENT_CLINIC_OR_DEPARTMENT_OTHER): Payer: Managed Care, Other (non HMO) | Admitting: Hematology & Oncology

## 2015-05-19 ENCOUNTER — Encounter: Payer: Self-pay | Admitting: Hematology & Oncology

## 2015-05-19 LAB — IRON AND TIBC CHCC
%SAT: 99 % — ABNORMAL HIGH (ref 20–55)
Iron: 266 ug/dL — ABNORMAL HIGH (ref 42–163)
TIBC: 269 ug/dL (ref 202–409)
UIBC: 3 ug/dL — AB (ref 117–376)

## 2015-05-19 LAB — CBC WITH DIFFERENTIAL (CANCER CENTER ONLY)
BASO#: 0 10*3/uL (ref 0.0–0.2)
BASO%: 0.4 % (ref 0.0–2.0)
EOS%: 3 % (ref 0.0–7.0)
Eosinophils Absolute: 0.2 10*3/uL (ref 0.0–0.5)
HCT: 41.6 % (ref 38.7–49.9)
HGB: 14.4 g/dL (ref 13.0–17.1)
LYMPH#: 1.8 10*3/uL (ref 0.9–3.3)
LYMPH%: 32.7 % (ref 14.0–48.0)
MCH: 33.6 pg — AB (ref 28.0–33.4)
MCHC: 34.6 g/dL (ref 32.0–35.9)
MCV: 97 fL (ref 82–98)
MONO#: 0.7 10*3/uL (ref 0.1–0.9)
MONO%: 12.1 % (ref 0.0–13.0)
NEUT#: 2.8 10*3/uL (ref 1.5–6.5)
NEUT%: 51.8 % (ref 40.0–80.0)
PLATELETS: 207 10*3/uL (ref 145–400)
RBC: 4.28 10*6/uL (ref 4.20–5.70)
RDW: 13.7 % (ref 11.1–15.7)
WBC: 5.4 10*3/uL (ref 4.0–10.0)

## 2015-05-19 LAB — FERRITIN CHCC: FERRITIN: 38 ng/mL (ref 22–316)

## 2015-05-19 NOTE — Progress Notes (Signed)
Hematology and Oncology Follow Up Visit  Austin Summers [id number] October 12, 1960 54 y.o. 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  Current Therapy:    Phlebotomy to maintain ferritin less than 100     Interim History:  Mr.  Summers is back for followup.he is doing pretty well. He is still traveling.  He and his wife just moved to a new house. They've a busy getting this all set up.  He is not exercising as he would like. Hopefully, he'll be able to exercise as a joint a new club.  He was last phlebotomized a few months ago. His ferritin was 149. I think he got 3 phlebotomies.   There's been no problems with fever. He's had no cough or shortness of breath. There's been no change in bowel or bladder habits. He's had no rashes. He's had no joint issues.  Overall, his performance status is ECOG 0.  Medications:  Current outpatient prescriptions:  .  aspirin 81 MG tablet, Take 81 mg by mouth daily., Disp: , Rfl:  .  Melatonin 2.5 MG CAPS, Take by mouth as needed., Disp: , Rfl:   Allergies:  Allergies  Allergen Reactions  . Other Shortness Of Breath    BEES  . Penicillins Other (See Comments)    Had as child does not know reaction.    Past Medical History, Surgical history, Social history, and Family History were reviewed and updated.  Review of Systems: As above  Physical Exam:  height is  (1.753 m) and weight is 214 lb (97.07 kg). His oral temperature is 97.6 F (36.4 C). His blood pressure is 148/94 and his pulse is 58. His respiration is 16.   Well-developed and well-nourished white 7. Head and neck exam shows no ocular or oral lesions. He has no palpable cervical or supraclavicular lymph nodes. Lungs are clear bilateral. Cardiac exam regular in rhythm with no murmurs rubs or bruits. Abdomen is soft. Has good bowel sounds. There is no fluid wave. There is no palpable liver or spleen tip. Back exam shows no tenderness over  the spine ribs or hips. Extremities shows no clubbing cyanosis or edema. Neurological exam shows no focal neurological deficits.  Lab Results  Component Value Date   WBC 5.4 05/19/2015   HGB 14.4 05/19/2015   HCT 41.6 05/19/2015   MCV 97 05/19/2015   PLT 207 05/19/2015     Chemistry      Component Value Date/Time   NA 141 08/19/2014 0919   K 4.7 08/19/2014 0919   CL 106 08/19/2014 0919   CO2 27 08/19/2014 0919   BUN 9 08/19/2014 0919   CREATININE 0.93 08/19/2014 0919      Component Value Date/Time   CALCIUM 9.1 08/19/2014 0919   ALKPHOS 74 08/19/2014 0919   AST 25 08/19/2014 0919   ALT 32 08/19/2014 0919   BILITOT 0.5 08/19/2014 0919         Impression and Plan: Austin Summers is 54 year old gentleman with hemochromatosis. He is homozygous for the major mutation. He has done incredibly well.   We will see what his iron studies show. Hopefully after 3 phlebotomies, his iron studies will be adequate.  We will continue to check his every 3 month blood check.    I'll see him back in 6 months.   Austin Macho, MD 8/8/201611:25 AM

## 2015-05-20 ENCOUNTER — Telehealth: Payer: Self-pay | Admitting: *Deleted

## 2015-05-20 NOTE — Telephone Encounter (Signed)
-----   Message from [name], MD sent at 05/19/2015  4:29 PM EDT ----- Call -  Ferritin is 38.  No phlebotomy!!!  See you at the [facility]!!" Austin Summers

## 2015-08-18 ENCOUNTER — Other Ambulatory Visit (HOSPITAL_BASED_OUTPATIENT_CLINIC_OR_DEPARTMENT_OTHER): Payer: Managed Care, Other (non HMO)

## 2015-08-18 LAB — CBC WITH DIFFERENTIAL (CANCER CENTER ONLY)
BASO#: 0 10*3/uL (ref 0.0–0.2)
BASO%: 0.4 % (ref 0.0–2.0)
EOS ABS: 0.1 10*3/uL (ref 0.0–0.5)
EOS%: 2.5 % (ref 0.0–7.0)
HCT: 44.8 % (ref 38.7–49.9)
HGB: 15.3 g/dL (ref 13.0–17.1)
LYMPH#: 1.6 10*3/uL (ref 0.9–3.3)
LYMPH%: 33.7 % (ref 14.0–48.0)
MCH: 33.8 pg — AB (ref 28.0–33.4)
MCHC: 34.2 g/dL (ref 32.0–35.9)
MCV: 99 fL — ABNORMAL HIGH (ref 82–98)
MONO#: 0.5 10*3/uL (ref 0.1–0.9)
MONO%: 9.9 % (ref 0.0–13.0)
NEUT#: 2.6 10*3/uL (ref 1.5–6.5)
NEUT%: 53.5 % (ref 40.0–80.0)
PLATELETS: 222 10*3/uL (ref 145–400)
RBC: 4.53 10*6/uL (ref 4.20–5.70)
RDW: 11.8 % (ref 11.1–15.7)
WBC: 4.9 10*3/uL (ref 4.0–10.0)

## 2015-08-18 LAB — COMPREHENSIVE METABOLIC PANEL (CC13)
ALBUMIN: 4.1 g/dL (ref 3.5–5.0)
ALK PHOS: 81 U/L (ref 40–150)
ALT: 49 U/L (ref 0–55)
ANION GAP: 12 meq/L — AB (ref 3–11)
AST: 39 U/L — ABNORMAL HIGH (ref 5–34)
BILIRUBIN TOTAL: 1.13 mg/dL (ref 0.20–1.20)
BUN: 11.1 mg/dL (ref 7.0–26.0)
CALCIUM: 9.3 mg/dL (ref 8.4–10.4)
CO2: 21 mEq/L — ABNORMAL LOW (ref 22–29)
CREATININE: 1 mg/dL (ref 0.7–1.3)
Chloride: 107 mEq/L (ref 98–109)
Glucose: 84 mg/dl (ref 70–140)
Potassium: 4.3 mEq/L (ref 3.5–5.1)
Sodium: 140 mEq/L (ref 136–145)
TOTAL PROTEIN: 8.4 g/dL — AB (ref 6.4–8.3)

## 2015-08-18 LAB — FERRITIN CHCC: FERRITIN: 117 ng/mL (ref 22–316)

## 2015-08-18 LAB — IRON AND TIBC CHCC
%SAT: 55 % (ref 20–55)
IRON: 151 ug/dL (ref 42–163)
TIBC: 274 ug/dL (ref 202–409)
UIBC: 123 ug/dL (ref 117–376)

## 2015-08-19 ENCOUNTER — Telehealth: Payer: Self-pay | Admitting: *Deleted

## 2015-08-19 NOTE — Telephone Encounter (Addendum)
Message left for patient, and message sent to scheduler.   ----- Message from Josph MachoPeter R Ennever, MD sent at 08/18/2015  6:16 PM EST ----- calll - ferritin is too high!!  He needs 2 phlebotomies!!!  Please set up!!!  Cindee LamePete

## 2015-08-22 ENCOUNTER — Ambulatory Visit (HOSPITAL_BASED_OUTPATIENT_CLINIC_OR_DEPARTMENT_OTHER): Payer: Managed Care, Other (non HMO)

## 2015-08-22 NOTE — Progress Notes (Signed)
Eldridge ScotMichael Howard Gaut presents today for phlebotomy per MD orders. Phlebotomy procedure started at 1455 and ended at 1303. 500 ml removed. Patient observed for 30 minutes after procedure without any incident. Patient tolerated procedure well. IV needle removed intact.

## 2015-08-22 NOTE — Patient Instructions (Signed)
Therapeutic Phlebotomy, Care After  Refer to this sheet in the next few weeks. These instructions provide you with information about caring for yourself after your procedure. Your health care provider may also give you more specific instructions. Your treatment has been planned according to current medical practices, but problems sometimes occur. Call your health care provider if you have any problems or questions after your procedure.  WHAT TO EXPECT AFTER THE PROCEDURE  After your procedure, it is common to have:   Light-headedness or dizziness. You may feel faint.   Nausea.   Tiredness.  HOME CARE INSTRUCTIONS  Activities   Return to your normal activities as directed by your health care provider. Most people can go back to their normal activities right away.   Avoid strenuous physical activity and heavy lifting or pulling for about 5 hours after the procedure. Do not lift anything that is heavier than 10 lb (4.5 kg).   Athletes should avoid strenuous exercise for at least 12 hours.   Change positions slowly for the remainder of the day. This will help to prevent light-headedness or fainting.   If you feel light-headed, lie down until the feeling goes away.  Eating and Drinking   Be sure to eat well-balanced meals for the next 24 hours.   Drink enough fluid to keep your urine clear or pale yellow.   Avoid drinking alcohol on the day that you had the procedure.  Care of the Needle Insertion Site   Keep your bandage dry. You can remove the bandage after about 5 hours or as directed by your health care provider.   If you have bleeding from the needle insertion site, elevate your arm and press firmly on the site until the bleeding stops.   If you have bruising at the site, apply ice to the area:   Put ice in a plastic bag.   Place a towel between your skin and the bag.   Leave the ice on for 20 minutes, 2-3 times a day for the first 24 hours.   If the swelling does not go away after 24 hours, apply  a warm, moist washcloth to the area for 20 minutes, 2-3 times a day.  General Instructions   Avoid smoking for at least 30 minutes after the procedure.   Keep all follow-up visits as directed by your health care provider. It is important to continue with further therapeutic phlebotomy treatments as directed.  SEEK MEDICAL CARE IF:   You have redness, swelling, or pain at the needle insertion site.   You have fluid, blood, or pus coming from the needle insertion site.   You feel light-headed, dizzy, or nauseated, and the feeling does not go away.   You notice new bruising at the needle insertion site.   You feel weaker than normal.   You have a fever or chills.  SEEK IMMEDIATE MEDICAL CARE IF:   You have severe nausea or vomiting.   You have chest pain.   You have trouble breathing.    This information is not intended to replace advice given to you by your health care provider. Make sure you discuss any questions you have with your health care provider.    Document Released: 03/01/2011 Document Revised: 02/11/2015 Document Reviewed: 09/23/2014  Elsevier Interactive Patient Education 2016 [employer]

## 2015-09-08 ENCOUNTER — Ambulatory Visit (HOSPITAL_BASED_OUTPATIENT_CLINIC_OR_DEPARTMENT_OTHER): Payer: Managed Care, Other (non HMO)

## 2015-09-08 NOTE — Progress Notes (Signed)
Austin Summers presents today for phlebotomy per MD orders. Phlebotomy procedure started at 1310 and ended at 1320. 500 grams removed. Patient observed for 30 minutes after procedure without any incident. Patient tolerated procedure well. IV needle removed intact.

## 2015-09-08 NOTE — Patient Instructions (Signed)

## 2015-11-14 ENCOUNTER — Other Ambulatory Visit: Payer: Self-pay | Admitting: *Deleted

## 2015-11-17 ENCOUNTER — Ambulatory Visit (HOSPITAL_BASED_OUTPATIENT_CLINIC_OR_DEPARTMENT_OTHER): Payer: Managed Care, Other (non HMO) | Admitting: Hematology & Oncology

## 2015-11-17 ENCOUNTER — Encounter: Payer: Self-pay | Admitting: Hematology & Oncology

## 2015-11-17 ENCOUNTER — Other Ambulatory Visit (HOSPITAL_BASED_OUTPATIENT_CLINIC_OR_DEPARTMENT_OTHER): Payer: Managed Care, Other (non HMO)

## 2015-11-17 LAB — CBC WITH DIFFERENTIAL (CANCER CENTER ONLY)
BASO#: 0 10*3/uL (ref 0.0–0.2)
BASO%: 0.5 % (ref 0.0–2.0)
EOS ABS: 0.1 10*3/uL (ref 0.0–0.5)
EOS%: 3.3 % (ref 0.0–7.0)
HEMATOCRIT: 43.2 % (ref 38.7–49.9)
HGB: 14.2 g/dL (ref 13.0–17.1)
LYMPH#: 1.7 10*3/uL (ref 0.9–3.3)
LYMPH%: 42.7 % (ref 14.0–48.0)
MCH: 32.9 pg (ref 28.0–33.4)
MCHC: 32.9 g/dL (ref 32.0–35.9)
MCV: 100 fL — AB (ref 82–98)
MONO#: 0.4 10*3/uL (ref 0.1–0.9)
MONO%: 10.4 % (ref 0.0–13.0)
NEUT%: 43.1 % (ref 40.0–80.0)
NEUTROS ABS: 1.7 10*3/uL (ref 1.5–6.5)
PLATELETS: 219 10*3/uL (ref 145–400)
RBC: 4.32 10*6/uL (ref 4.20–5.70)
RDW: 12.1 % (ref 11.1–15.7)
WBC: 4 10*3/uL (ref 4.0–10.0)

## 2015-11-17 LAB — CMP (CANCER CENTER ONLY)
ALT(SGPT): 44 U/L (ref 10–47)
AST: 32 U/L (ref 11–38)
Albumin: 3.6 g/dL (ref 3.3–5.5)
Alkaline Phosphatase: 70 U/L (ref 26–84)
BUN: 14 mg/dL (ref 7–22)
CALCIUM: 9.4 mg/dL (ref 8.0–10.3)
CHLORIDE: 109 meq/L — AB (ref 98–108)
CO2: 26 mEq/L (ref 18–33)
Creat: 0.9 mg/dl (ref 0.6–1.2)
Glucose, Bld: 99 mg/dL (ref 73–118)
POTASSIUM: 4.9 meq/L — AB (ref 3.3–4.7)
Sodium: 143 mEq/L (ref 128–145)
TOTAL PROTEIN: 8.3 g/dL — AB (ref 6.4–8.1)
Total Bilirubin: 0.5 mg/dl (ref 0.20–1.60)

## 2015-11-17 LAB — IRON AND TIBC
%SAT: 27 % (ref 20–55)
IRON: 79 ug/dL (ref 42–163)
TIBC: 293 ug/dL (ref 202–409)
UIBC: 213 ug/dL (ref 117–376)

## 2015-11-17 LAB — FERRITIN: Ferritin: 25 ng/ml (ref 22–316)

## 2015-11-17 NOTE — Progress Notes (Signed)
Hematology and Oncology Follow Up Visit  Austin Summers [id number] [birth year] 55 y.o. 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  Current Therapy:    Phlebotomy to maintain ferritin less than 100     Interim History:  Mr.  Summers is back for followup.he is doing pretty well. He is still traveling. He and his family went down to [place] right before Christmas. That great time.  He was last phlebotomized in November. At that point time, his ferritin was 117 with iron saturation of 55%.  He is still working. Thank you, he has not had a travel halfway around the [place], probably until summertime.  His kids are doing well. One is now up in [place].  He's had no problems with cough. He's had no chest pain. He's had no nausea or vomiting. Has not been any change in bowel or bladder habits.  He's had no fever, sweats or chills.  Overall, his performance status is ECOG 0.  Medications:  Current outpatient prescriptions:  .  aspirin 81 MG tablet, Take 81 mg by mouth daily., Disp: , Rfl:  .  Melatonin 2.5 MG CAPS, Take by mouth as needed., Disp: , Rfl:   Allergies:  Allergies  Allergen Reactions  . Other Shortness Of Breath    BEES  . Penicillins Other (See Comments)    Had as child does not know reaction.    Past Medical History, Surgical history, Social history, and Family History were reviewed and updated.  Review of Systems: As above  Physical Exam:  height is  (1.753 m) and weight is 215 lb (97.523 kg). His oral temperature is 98.3 F (36.8 C). His blood pressure is 131/83 and his pulse is 80. His respiration is 16.   Well-developed and well-nourished white 7. Head and neck exam shows no ocular or oral lesions. He has no palpable cervical or supraclavicular lymph nodes. Lungs are clear bilateral. Cardiac exam regular rate and rhythm with no murmurs rubs or bruits. Abdomen is soft. Has good bowel sounds. There is no  fluid wave. There is no palpable liver or spleen tip. Back exam shows no tenderness over the spine ribs or hips. Extremities shows no clubbing cyanosis or edema. Neurological exam shows no focal neurological deficits.  Lab Results  Component Value Date   WBC 4.0 11/17/2015   HGB 14.2 11/17/2015   HCT 43.2 11/17/2015   MCV 100* 11/17/2015   PLT 219 11/17/2015     Chemistry      Component Value Date/Time   NA 143 11/17/2015 0902   NA 140 08/18/2015 0921   NA 141 08/19/2014 0919   K 4.9* 11/17/2015 0902   K 4.3 08/18/2015 0921   K 4.7 08/19/2014 0919   CL 109* 11/17/2015 0902   CL 106 08/19/2014 0919   CO2 26 11/17/2015 0902   CO2 21* 08/18/2015 0921   CO2 27 08/19/2014 0919   BUN 14 11/17/2015 0902   BUN 11.1 08/18/2015 0921   BUN 9 08/19/2014 0919   CREATININE 0.9 11/17/2015 0902   CREATININE 1.0 08/18/2015 0921   CREATININE 0.93 08/19/2014 0919      Component Value Date/Time   CALCIUM 9.4 11/17/2015 0902   CALCIUM 9.3 08/18/2015 0921   CALCIUM 9.1 08/19/2014 0919   ALKPHOS 70 11/17/2015 0902   ALKPHOS 81 08/18/2015 0921   ALKPHOS 74 08/19/2014 0919   AST 32 11/17/2015 0902   AST 39* 08/18/2015 0921   AST 25  08/19/2014 0919   ALT 44 11/17/2015 0902   ALT 49 08/18/2015 0921   ALT 32 08/19/2014 0919   BILITOT 0.50 11/17/2015 0902   BILITOT 1.13 08/18/2015 0921   BILITOT 0.5 08/19/2014 0919         Impression and Plan: Austin Summers is 55 year old gentleman with hemochromatosis. He is homozygous for the major mutation. He has done incredibly well.   We will see what his iron studies show. Hopefully after 2 phlebotomies, his iron studies will be adequate.  We will continue to check his every 3 month blood check.    I'll see him back in 6 months.   [name], MD 2/6/20179:57 AM

## 2015-11-18 ENCOUNTER — Telehealth: Payer: Self-pay | Admitting: *Deleted

## 2015-11-18 NOTE — Telephone Encounter (Signed)
-----   Message from [name], MD sent at 11/17/2015  5:21 PM EST ----- Call - iron levels are great!!!  No need for phlebootmy!!!  pete

## 2016-02-13 ENCOUNTER — Other Ambulatory Visit (HOSPITAL_BASED_OUTPATIENT_CLINIC_OR_DEPARTMENT_OTHER): Payer: Managed Care, Other (non HMO)

## 2016-02-13 ENCOUNTER — Telehealth: Payer: Self-pay | Admitting: Nurse Practitioner

## 2016-02-13 LAB — CBC WITH DIFFERENTIAL (CANCER CENTER ONLY)
BASO#: 0 10*3/uL (ref 0.0–0.2)
BASO%: 0.4 % (ref 0.0–2.0)
EOS%: 3.6 % (ref 0.0–7.0)
Eosinophils Absolute: 0.2 10*3/uL (ref 0.0–0.5)
HCT: 47.4 % (ref 38.7–49.9)
HGB: 16.3 g/dL (ref 13.0–17.1)
LYMPH#: 1.8 10*3/uL (ref 0.9–3.3)
LYMPH%: 36.7 % (ref 14.0–48.0)
MCH: 33.7 pg — ABNORMAL HIGH (ref 28.0–33.4)
MCHC: 34.4 g/dL (ref 32.0–35.9)
MCV: 98 fL (ref 82–98)
MONO#: 0.5 10*3/uL (ref 0.1–0.9)
MONO%: 10.4 % (ref 0.0–13.0)
NEUT#: 2.5 10*3/uL (ref 1.5–6.5)
NEUT%: 48.9 % (ref 40.0–80.0)
PLATELETS: 208 10*3/uL (ref 145–400)
RBC: 4.84 10*6/uL (ref 4.20–5.70)
RDW: 12.9 % (ref 11.1–15.7)
WBC: 5 10*3/uL (ref 4.0–10.0)

## 2016-02-13 LAB — IRON AND TIBC
%SAT: 100 % — ABNORMAL HIGH (ref 20–55)
IRON: 278 ug/dL — AB (ref 42–163)
TIBC: 278 ug/dL (ref 202–409)

## 2016-02-13 LAB — FERRITIN: FERRITIN: 91 ng/mL (ref 22–316)

## 2016-02-13 NOTE — Telephone Encounter (Addendum)
LVM on pt's personal machine. ----- Message from Josph MachoPeter R Ennever, MD sent at 02/13/2016 12:44 PM EDT ----- Call - ferritin is too high!!  He will need 3 phlebotomies!!! Please set this up!!  pete

## 2016-02-20 ENCOUNTER — Ambulatory Visit (HOSPITAL_BASED_OUTPATIENT_CLINIC_OR_DEPARTMENT_OTHER): Payer: Managed Care, Other (non HMO)

## 2016-02-20 NOTE — Progress Notes (Signed)
Austin Summers presents today for phlebotomy per MD orders. Phlebotomy procedure started at 1510 and ended at 1520. 500 grams removed. Patient observed for 30 minutes after procedure without any incident. Patient tolerated procedure well. IV needle removed intact.

## 2016-02-20 NOTE — Patient Instructions (Signed)

## 2016-02-27 ENCOUNTER — Ambulatory Visit (HOSPITAL_BASED_OUTPATIENT_CLINIC_OR_DEPARTMENT_OTHER): Payer: Managed Care, Other (non HMO)

## 2016-02-27 NOTE — Patient Instructions (Signed)

## 2016-02-27 NOTE — Progress Notes (Signed)
Austin MarkeraMichael Howard [place] presents today for phlebotomy per MD orders. Phlebotomy procedure started at 1310 and ended at 1320. 500 grams removed. Patient observed for 30 minutes after procedure without any incident. Patient tolerated procedure well. IV needle removed intact.

## 2016-03-09 ENCOUNTER — Ambulatory Visit (HOSPITAL_BASED_OUTPATIENT_CLINIC_OR_DEPARTMENT_OTHER): Payer: Managed Care, Other (non HMO)

## 2016-03-09 NOTE — Patient Instructions (Signed)
Therapeutic Phlebotomy, Care After  Refer to this sheet in the next few weeks. These instructions provide you with information about caring for yourself after your procedure. Your health care provider may also give you more specific instructions. Your treatment has been planned according to current medical practices, but problems sometimes occur. Call your health care provider if you have any problems or questions after your procedure.  WHAT TO EXPECT AFTER THE PROCEDURE  After your procedure, it is common to have:   Light-headedness or dizziness. You may feel faint.   Nausea.   Tiredness.  HOME CARE INSTRUCTIONS  Activities   Return to your normal activities as directed by your health care provider. Most people can go back to their normal activities right away.   Avoid strenuous physical activity and heavy lifting or pulling for about 5 hours after the procedure. Do not lift anything that is heavier than 10 lb (4.5 kg).   Athletes should avoid strenuous exercise for at least 12 hours.   Change positions slowly for the remainder of the day. This will help to prevent light-headedness or fainting.   If you feel light-headed, lie down until the feeling goes away.  Eating and Drinking   Be sure to eat well-balanced meals for the next 24 hours.   Drink enough fluid to keep your urine clear or pale yellow.   Avoid drinking alcohol on the day that you had the procedure.  Care of the Needle Insertion Site   Keep your bandage dry. You can remove the bandage after about 5 hours or as directed by your health care provider.   If you have bleeding from the needle insertion site, elevate your arm and press firmly on the site until the bleeding stops.   If you have bruising at the site, apply ice to the area:   Put ice in a plastic bag.   Place a towel between your skin and the bag.   Leave the ice on for 20 minutes, 2-3 times a day for the first 24 hours.   If the swelling does not go away after 24 hours, apply  a warm, moist washcloth to the area for 20 minutes, 2-3 times a day.  General Instructions   Avoid smoking for at least 30 minutes after the procedure.   Keep all follow-up visits as directed by your health care provider. It is important to continue with further therapeutic phlebotomy treatments as directed.  SEEK MEDICAL CARE IF:   You have redness, swelling, or pain at the needle insertion site.   You have fluid, blood, or pus coming from the needle insertion site.   You feel light-headed, dizzy, or nauseated, and the feeling does not go away.   You notice new bruising at the needle insertion site.   You feel weaker than normal.   You have a fever or chills.  SEEK IMMEDIATE MEDICAL CARE IF:   You have severe nausea or vomiting.   You have chest pain.   You have trouble breathing.    This information is not intended to replace advice given to you by your health care provider. Make sure you discuss any questions you have with your health care provider.    Document Released: 03/01/2011 Document Revised: 02/11/2015 Document Reviewed: 09/23/2014  Elsevier Interactive Patient Education 2016 [employer]

## 2016-03-09 NOTE — Progress Notes (Signed)
Austin Summers presents today for phlebotomy per MD orders. Phlebotomy procedure started at 1310 and ended at 1318 500 grams removed. Patient observed for 30 minutes after procedure without any incident. Patient tolerated procedure well. IV needle removed intact.

## 2016-05-13 ENCOUNTER — Other Ambulatory Visit: Payer: Managed Care, Other (non HMO)

## 2016-05-17 ENCOUNTER — Other Ambulatory Visit: Payer: Managed Care, Other (non HMO)

## 2016-05-17 ENCOUNTER — Ambulatory Visit: Payer: Managed Care, Other (non HMO) | Admitting: Hematology & Oncology

## 2016-05-24 ENCOUNTER — Ambulatory Visit (HOSPITAL_BASED_OUTPATIENT_CLINIC_OR_DEPARTMENT_OTHER): Payer: Managed Care, Other (non HMO) | Admitting: Hematology & Oncology

## 2016-05-24 ENCOUNTER — Encounter: Payer: Self-pay | Admitting: Hematology & Oncology

## 2016-05-24 ENCOUNTER — Other Ambulatory Visit (HOSPITAL_BASED_OUTPATIENT_CLINIC_OR_DEPARTMENT_OTHER): Payer: Managed Care, Other (non HMO)

## 2016-05-24 LAB — CBC WITH DIFFERENTIAL (CANCER CENTER ONLY)
BASO#: 0 10*3/uL (ref 0.0–0.2)
BASO%: 0.5 % (ref 0.0–2.0)
EOS ABS: 0.1 10*3/uL (ref 0.0–0.5)
EOS%: 2.9 % (ref 0.0–7.0)
HCT: 43.5 % (ref 38.7–49.9)
HEMOGLOBIN: 14.8 g/dL (ref 13.0–17.1)
LYMPH#: 1.7 10*3/uL (ref 0.9–3.3)
LYMPH%: 41.5 % (ref 14.0–48.0)
MCH: 32.5 pg (ref 28.0–33.4)
MCHC: 34 g/dL (ref 32.0–35.9)
MCV: 95 fL (ref 82–98)
MONO#: 0.5 10*3/uL (ref 0.1–0.9)
MONO%: 10.9 % (ref 0.0–13.0)
NEUT%: 44.2 % (ref 40.0–80.0)
NEUTROS ABS: 1.8 10*3/uL (ref 1.5–6.5)
Platelets: 214 10*3/uL (ref 145–400)
RBC: 4.56 10*6/uL (ref 4.20–5.70)
RDW: 12.3 % (ref 11.1–15.7)
WBC: 4.1 10*3/uL (ref 4.0–10.0)

## 2016-05-24 NOTE — Progress Notes (Signed)
Hematology and Oncology Follow Up Visit  [name] [id number] 1961-03-10 55 y.o. 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  Current Therapy:    Phlebotomy to maintain ferritin less than 100     Interim History:  Mr.  Austin Summers is back for followup.he is doing pretty well. He is still traveling. [place] I think he will go over there 2 or 3 more times this year.  Otherwise, he is doing pretty well. Back when he was here in May, his ferritin was 91 with iron saturation of 100%. I think he had a couple phlebotomies.  He's had no proms with arthritic issues. He's had no rashes. He's had no cough or shortness of breath. He's had no change in bowel or bladder habits.  Overall, his family is doing quite well.   Overall, his performance status is ECOG 0.  Medications:  Current Outpatient Prescriptions:  .  aspirin 81 MG tablet, Take 81 mg by mouth daily., Disp: , Rfl:   Allergies:  Allergies  Allergen Reactions  . Other Shortness Of Breath    BEES  . Penicillins Other (See Comments)    Had as child does not know reaction.    Past Medical History, Surgical history, Social history, and Family History were reviewed and updated.  Review of Systems: As above  Physical Exam:  height is 5\' 9"  (1.753 m) and weight is 210 lb (95.3 kg). His oral temperature is 97.5 F (36.4 C). His blood pressure is 142/85 (abnormal) and his pulse is 69. His respiration is 16.   Well-developed and well-nourished white 7. Head and neck exam shows no ocular or oral lesions. He has no palpable cervical or supraclavicular lymph nodes. Lungs are clear bilateral. Cardiac exam regular rate and rhythm with no murmurs rubs or bruits. Abdomen is soft. Has good bowel sounds. There is no fluid wave. There is no palpable liver or spleen tip. Back exam shows no tenderness over the spine ribs or hips. Extremities shows no clubbing  cyanosis or edema. Neurological exam shows no focal neurological deficits.  Lab Results  Component Value Date   WBC 4.1 05/24/2016   HGB 14.8 05/24/2016   HCT 43.5 05/24/2016   MCV 95 05/24/2016   PLT 214 05/24/2016     Chemistry      Component Value Date/Time   NA 143 11/17/2015 0902   NA 140 08/18/2015 0921   K 4.9 (H) 11/17/2015 0902   K 4.3 08/18/2015 0921   CL 109 (H) 11/17/2015 0902   CO2 26 11/17/2015 0902   CO2 21 (L) 08/18/2015 0921   BUN 14 11/17/2015 0902   BUN 11.1 08/18/2015 0921   CREATININE 0.9 11/17/2015 0902   CREATININE 1.0 08/18/2015 0921      Component Value Date/Time   CALCIUM 9.4 11/17/2015 0902   CALCIUM 9.3 08/18/2015 0921   ALKPHOS 70 11/17/2015 0902   ALKPHOS 81 08/18/2015 0921   AST 32 11/17/2015 0902   AST 39 (H) 08/18/2015 0921   ALT 44 11/17/2015 0902   ALT 49 08/18/2015 0921   BILITOT 0.50 11/17/2015 0902   BILITOT 1.13 08/18/2015 0921         Impression and Plan: Mr. Austin Summers is 55 year old gentleman with hemochromatosis. He is homozygous for the major mutation. He has done incredibly well.   We will see what his iron studies show. Hopefully after 2 phlebotomies, his iron studies will be adequate.  We will continue to check his blood every 3 month.   I'll see him back in 6 months.   Josph MachoENNEVER,PETER R, MD 8/14/20171:05 PM

## 2016-05-25 LAB — IRON AND TIBC
%SAT: 22 % (ref 20–55)
Iron: 71 ug/dL (ref 42–163)
TIBC: 320 ug/dL (ref 202–409)
UIBC: 249 ug/dL (ref 117–376)

## 2016-05-25 LAB — FERRITIN: Ferritin: 25 ng/ml (ref 22–316)

## 2016-05-26 ENCOUNTER — Telehealth: Payer: Self-pay | Admitting: *Deleted

## 2016-05-26 NOTE — Telephone Encounter (Signed)
-----   Message from Josph MachoPeter R Ennever, MD sent at 05/25/2016 11:48 AM EDT ----- Call - iron level is only 25!!!!  The iron saturation is 22%!!  This is great!!!

## 2016-08-09 ENCOUNTER — Other Ambulatory Visit (HOSPITAL_BASED_OUTPATIENT_CLINIC_OR_DEPARTMENT_OTHER): Payer: Managed Care, Other (non HMO)

## 2016-08-09 LAB — CBC WITH DIFFERENTIAL (CANCER CENTER ONLY)
BASO#: 0 10*3/uL (ref 0.0–0.2)
BASO%: 0.5 % (ref 0.0–2.0)
EOS ABS: 0.1 10*3/uL (ref 0.0–0.5)
EOS%: 2.8 % (ref 0.0–7.0)
HCT: 42.7 % (ref 38.7–49.9)
HEMOGLOBIN: 14.8 g/dL (ref 13.0–17.1)
LYMPH#: 1.7 10*3/uL (ref 0.9–3.3)
LYMPH%: 39.2 % (ref 14.0–48.0)
MCH: 33 pg (ref 28.0–33.4)
MCHC: 34.7 g/dL (ref 32.0–35.9)
MCV: 95 fL (ref 82–98)
MONO#: 0.5 10*3/uL (ref 0.1–0.9)
MONO%: 10.4 % (ref 0.0–13.0)
NEUT%: 47.1 % (ref 40.0–80.0)
NEUTROS ABS: 2 10*3/uL (ref 1.5–6.5)
PLATELETS: 213 10*3/uL (ref 145–400)
RBC: 4.48 10*6/uL (ref 4.20–5.70)
RDW: 14 % (ref 11.1–15.7)
WBC: 4.3 10*3/uL (ref 4.0–10.0)

## 2016-08-09 LAB — IRON AND TIBC
%SAT: 78 % — AB (ref 20–55)
Iron: 217 ug/dL — ABNORMAL HIGH (ref 42–163)
TIBC: 279 ug/dL (ref 202–409)
UIBC: 62 ug/dL — AB (ref 117–376)

## 2016-08-09 LAB — FERRITIN: FERRITIN: 53 ng/mL (ref 22–316)

## 2016-08-10 ENCOUNTER — Telehealth: Payer: Self-pay | Admitting: *Deleted

## 2016-08-10 NOTE — Telephone Encounter (Addendum)
Patient aware of results. Message sent to scheduler  ----- Message from Josph MachoPeter R Ennever, MD sent at 08/09/2016  6:31 PM EDT ----- Call - the iron saturation is up a little too high for my taste!!  We really need to phlebotomize  Weekly x 2 week.  pete

## 2016-08-11 ENCOUNTER — Other Ambulatory Visit: Payer: Managed Care, Other (non HMO)

## 2016-08-12 ENCOUNTER — Ambulatory Visit (HOSPITAL_BASED_OUTPATIENT_CLINIC_OR_DEPARTMENT_OTHER): Payer: Managed Care, Other (non HMO)

## 2016-08-12 NOTE — Patient Instructions (Signed)

## 2016-08-23 ENCOUNTER — Ambulatory Visit (HOSPITAL_BASED_OUTPATIENT_CLINIC_OR_DEPARTMENT_OTHER): Payer: Managed Care, Other (non HMO)

## 2016-08-23 NOTE — Progress Notes (Signed)
therapeutic phlebotomy performed in [facility] using 16G needle beginning at 1320 and ending at 1326 and yielding 500g. Pt tolerated well. Declined diet but took a soft drink and felt well at completion of 30 min observation.

## 2016-11-08 ENCOUNTER — Other Ambulatory Visit (HOSPITAL_BASED_OUTPATIENT_CLINIC_OR_DEPARTMENT_OTHER): Payer: 59

## 2016-11-08 ENCOUNTER — Telehealth: Payer: Self-pay | Admitting: *Deleted

## 2016-11-08 LAB — CBC WITH DIFFERENTIAL (CANCER CENTER ONLY)
BASO#: 0 10*3/uL (ref 0.0–0.2)
BASO%: 0.5 % (ref 0.0–2.0)
EOS%: 2.7 % (ref 0.0–7.0)
Eosinophils Absolute: 0.1 10*3/uL (ref 0.0–0.5)
HEMATOCRIT: 42.8 % (ref 38.7–49.9)
HGB: 14.4 g/dL (ref 13.0–17.1)
LYMPH#: 1.6 10*3/uL (ref 0.9–3.3)
LYMPH%: 37.8 % (ref 14.0–48.0)
MCH: 32.4 pg (ref 28.0–33.4)
MCHC: 33.6 g/dL (ref 32.0–35.9)
MCV: 96 fL (ref 82–98)
MONO#: 0.6 10*3/uL (ref 0.1–0.9)
MONO%: 13.4 % — ABNORMAL HIGH (ref 0.0–13.0)
NEUT#: 1.9 10*3/uL (ref 1.5–6.5)
NEUT%: 45.6 % (ref 40.0–80.0)
Platelets: 238 10*3/uL (ref 145–400)
RBC: 4.45 10*6/uL (ref 4.20–5.70)
RDW: 12.6 % (ref 11.1–15.7)
WBC: 4.1 10*3/uL (ref 4.0–10.0)

## 2016-11-08 LAB — FERRITIN: Ferritin: 28 ng/ml (ref 22–316)

## 2016-11-08 LAB — IRON AND TIBC
%SAT: 19 % — ABNORMAL LOW (ref 20–55)
IRON: 58 ug/dL (ref 42–163)
TIBC: 301 ug/dL (ref 202–409)
UIBC: 243 ug/dL (ref 117–376)

## 2016-11-08 NOTE — Telephone Encounter (Addendum)
Patient aware of results  ----- Message from Josph MachoPeter R Ennever, MD sent at 11/08/2016  1:48 PM EST ----- Call - iron level is great!!!!!  Ferritin is only 28!!!  pete

## 2016-11-09 ENCOUNTER — Other Ambulatory Visit: Payer: Managed Care, Other (non HMO)

## 2016-11-15 ENCOUNTER — Ambulatory Visit (HOSPITAL_BASED_OUTPATIENT_CLINIC_OR_DEPARTMENT_OTHER): Payer: 59 | Admitting: Hematology & Oncology

## 2016-11-15 NOTE — Progress Notes (Signed)
Hematology and Oncology Follow Up Visit  Austin Summers [id number] 10-08-1961 56 y.o. 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  Current Therapy:    Phlebotomy to maintain ferritin less than 100     Interim History:  Austin Summers is back for followup.he is doing pretty well. He is still traveling. He has been [place] often. He was there in November and December. After he got back, he and his family went down to [place]. He had a good time while he was down there.  His iron studies that were done a week or so ago showed a ferritin of only 28 and his iron saturation was only 19%. This is outstanding.  He's had no process with the flu. He's had no infections. He's had no fever. He's had no cough. He's had no change in bowel or bladder habits.  Overall, his performance status is ECOG 0.  Medications:  Current Outpatient Prescriptions:  .  aspirin 81 MG tablet, Take 81 mg by mouth daily., Disp: , Rfl:   Allergies:  Allergies  Allergen Reactions  . Other Shortness Of Breath    BEES  . Penicillins Other (See Comments)    Had as child does not know reaction.    Past Medical History, Surgical history, Social history, and Family History were reviewed and updated.  Review of Systems: As above  Physical Exam:  weight is 220 lb (99.8 kg). His oral temperature is 97.8 F (36.6 C). His blood pressure is 164/90 (abnormal) and his pulse is 86.   Well-developed and well-nourished white 7. Head and neck exam shows no ocular or oral lesions. He has no palpable cervical or supraclavicular lymph nodes. Lungs are clear bilateral. Cardiac exam regular rate and rhythm with no murmurs rubs or bruits. Abdomen is soft. Has good bowel sounds. There is no fluid wave. There is no palpable liver or spleen tip. Back exam shows no tenderness over the spine ribs or hips. Extremities shows no clubbing cyanosis or edema. Neurological exam  shows no focal neurological deficits.  Lab Results  Component Value Date   WBC 4.1 11/08/2016   HGB 14.4 11/08/2016   HCT 42.8 11/08/2016   MCV 96 11/08/2016   PLT 238 11/08/2016     Chemistry      Component Value Date/Time   NA 143 11/17/2015 0902   NA 140 08/18/2015 0921   K 4.9 (H) 11/17/2015 0902   K 4.3 08/18/2015 0921   CL 109 (H) 11/17/2015 0902   CO2 26 11/17/2015 0902   CO2 21 (L) 08/18/2015 0921   BUN 14 11/17/2015 0902   BUN 11.1 08/18/2015 0921   CREATININE 0.9 11/17/2015 0902   CREATININE 1.0 08/18/2015 0921      Component Value Date/Time   CALCIUM 9.4 11/17/2015 0902   CALCIUM 9.3 08/18/2015 0921   ALKPHOS 70 11/17/2015 0902   ALKPHOS 81 08/18/2015 0921   AST 32 11/17/2015 0902   AST 39 (H) 08/18/2015 0921   ALT 44 11/17/2015 0902   ALT 49 08/18/2015 0921   BILITOT 0.50 11/17/2015 0902   BILITOT 1.13 08/18/2015 0921         Impression and Plan: Austin Summers is 56 year old gentleman with hemochromatosis. He is homozygous for the major mutation. He has done incredibly well.   We will continue to check his iron studies every 3 months. I will see him back in 6 months.   Josph MachoENNEVER,PETER R, MD 2/5/201812:52  PM

## 2017-02-07 ENCOUNTER — Other Ambulatory Visit (HOSPITAL_BASED_OUTPATIENT_CLINIC_OR_DEPARTMENT_OTHER): Payer: 59

## 2017-02-07 LAB — CMP (CANCER CENTER ONLY)
ALK PHOS: 78 U/L (ref 26–84)
ALT: 36 U/L (ref 10–47)
AST: 39 U/L — ABNORMAL HIGH (ref 11–38)
Albumin: 3.8 g/dL (ref 3.3–5.5)
BUN: 13 mg/dL (ref 7–22)
CO2: 27 mEq/L (ref 18–33)
Calcium: 9.5 mg/dL (ref 8.0–10.3)
Chloride: 102 mEq/L (ref 98–108)
Creat: 1.1 mg/dl (ref 0.6–1.2)
GLUCOSE: 108 mg/dL (ref 73–118)
POTASSIUM: 4.2 meq/L (ref 3.3–4.7)
Sodium: 140 mEq/L (ref 128–145)
Total Bilirubin: 1.6 mg/dl (ref 0.20–1.60)
Total Protein: 7.9 g/dL (ref 6.4–8.1)

## 2017-02-07 LAB — CBC WITH DIFFERENTIAL (CANCER CENTER ONLY)
BASO#: 0 10*3/uL (ref 0.0–0.2)
BASO%: 0.2 % (ref 0.0–2.0)
EOS%: 2.2 % (ref 0.0–7.0)
Eosinophils Absolute: 0.1 10*3/uL (ref 0.0–0.5)
HCT: 43.2 % (ref 38.7–49.9)
HGB: 14.8 g/dL (ref 13.0–17.1)
LYMPH#: 1.5 10*3/uL (ref 0.9–3.3)
LYMPH%: 33.6 % (ref 14.0–48.0)
MCH: 33.3 pg (ref 28.0–33.4)
MCHC: 34.3 g/dL (ref 32.0–35.9)
MCV: 97 fL (ref 82–98)
MONO#: 0.7 10*3/uL (ref 0.1–0.9)
MONO%: 14.6 % — ABNORMAL HIGH (ref 0.0–13.0)
NEUT%: 49.4 % (ref 40.0–80.0)
NEUTROS ABS: 2.2 10*3/uL (ref 1.5–6.5)
PLATELETS: 222 10*3/uL (ref 145–400)
RBC: 4.45 10*6/uL (ref 4.20–5.70)
RDW: 13.4 % (ref 11.1–15.7)
WBC: 4.5 10*3/uL (ref 4.0–10.0)

## 2017-02-07 LAB — FERRITIN: FERRITIN: 34 ng/mL (ref 22–316)

## 2017-02-07 LAB — IRON AND TIBC
Iron: 272 ug/dL — ABNORMAL HIGH (ref 42–163)
TIBC: 264 ug/dL (ref 202–409)

## 2017-02-08 ENCOUNTER — Telehealth: Payer: Self-pay | Admitting: *Deleted

## 2017-02-08 NOTE — Telephone Encounter (Addendum)
Message left on personal voice mail. Message sent to scheduler  ----- Message from [name], MD sent at 02/07/2017  4:25 PM EDT ----- Call - the iron is way too high!!!  The iron saturation is 100%  We need to get this < 50%.  He needs weekly phlebotomy x 3 weeks. Please set this up!!  pete

## 2017-02-11 ENCOUNTER — Ambulatory Visit (HOSPITAL_BASED_OUTPATIENT_CLINIC_OR_DEPARTMENT_OTHER): Payer: BLUE CROSS/BLUE SHIELD

## 2017-02-11 NOTE — Patient Instructions (Signed)
Therapeutic Phlebotomy Therapeutic phlebotomy is the controlled removal of blood from a person's body for the purpose of treating a medical condition. The procedure is similar to donating blood. Usually, about a pint (470 mL, or 0.47L) of blood is removed. The average adult has 9-12 pints (4.3-5.7 L) of blood. Therapeutic phlebotomy may be used to treat the following medical conditions:  Hemochromatosis. This is a condition in which the blood contains too much iron.  Polycythemia vera. This is a condition in which the blood contains too many red blood cells.  Porphyria cutanea tarda. This is a disease in which an important part of hemoglobin is not made properly. It results in the buildup of abnormal amounts of porphyrins in the body.  Sickle cell disease. This is a condition in which the red blood cells form an abnormal crescent shape rather than a round shape. Tell a health care provider about:  Any allergies you have.  All medicines you are taking, including vitamins, herbs, eye drops, creams, and over-the-counter medicines.  Any problems you or family members have had with anesthetic medicines.  Any blood disorders you have.  Any surgeries you have had.  Any medical conditions you have. What are the risks? Generally, this is a safe procedure. However, problems may occur, including:  Nausea or light-headedness.  Low blood pressure.  Soreness, bleeding, swelling, or bruising at the needle insertion site.  Infection. What happens before the procedure?  Follow instructions from your health care provider about eating or drinking restrictions.  Ask your health care provider about changing or stopping your regular medicines. This is especially important if you are taking diabetes medicines or blood thinners.  Wear clothing with sleeves that can be raised above the elbow.  Plan to have someone take you home after the procedure.  You may have a blood sample taken. What happens  during the procedure?  A needle will be inserted into one of your veins.  Tubing and a collection bag will be attached to that needle.  Blood will flow through the needle and tubing into the collection bag.  You may be asked to open and close your hand slowly and continually during the entire collection.  After the specified amount of blood has been removed from your body, the collection bag and tubing will be clamped.  The needle will be removed from your vein.  Pressure will be held on the site of the needle insertion to stop the bleeding.  A bandage (dressing) will be placed over the needle insertion site. The procedure may vary among health care providers and hospitals. What happens after the procedure?  Your recovery will be assessed and monitored.  You can return to your normal activities as directed by your health care provider. This information is not intended to replace advice given to you by your health care provider. Make sure you discuss any questions you have with your health care provider. Document Released: 03/01/2011 Document Revised: 05/29/2016 Document Reviewed: 09/23/2014 Elsevier Interactive Patient Education  2017 [employer] Therapeutic Phlebotomy, Care After Refer to this sheet in the next few weeks. These instructions provide you with information about caring for yourself after your procedure. Your health care provider may also give you more specific instructions. Your treatment has been planned according to current medical practices, but problems sometimes occur. Call your health care provider if you have any problems or questions after your procedure. What can I expect after the procedure? After the procedure, it is common to have:  Light-headedness or dizziness. You may feel faint.  Nausea.  Tiredness. Follow these instructions at home: Activity   Return to your normal activities as directed by your health care provider. Most people can go back to  their normal activities right away.  Avoid strenuous physical activity and heavy lifting or pulling for about 5 hours after the procedure. Do not lift anything that is heavier than 10 lb (4.5 kg).  Athletes should avoid strenuous exercise for at least 12 hours.  Change positions slowly for the remainder of the day. This will help to prevent light-headedness or fainting.  If you feel light-headed, lie down until the feeling goes away. Eating and drinking   Be sure to eat well-balanced meals for the next 24 hours.  Drink enough fluid to keep your urine clear or pale yellow.  Avoid drinking alcohol on the day that you had the procedure. Care of the Needle Insertion Site   Keep your bandage dry. You can remove the bandage after about 5 hours or as directed by your health care provider.  If you have bleeding from the needle insertion site, elevate your arm and press firmly on the site until the bleeding stops.  If you have bruising at the site, apply ice to the area:  Put ice in a plastic bag.  Place a towel between your skin and the bag.  Leave the ice on for 20 minutes, 2-3 times a day for the first 24 hours.  If the swelling does not go away after 24 hours, apply a warm, moist washcloth to the area for 20 minutes, 2-3 times a day. General instructions   Avoid smoking for at least 30 minutes after the procedure.  Keep all follow-up visits as directed by your health care provider. It is important to continue with further therapeutic phlebotomy treatments as directed. Contact a health care provider if:  You have redness, swelling, or pain at the needle insertion site.  You have fluid, blood, or pus coming from the needle insertion site.  You feel light-headed, dizzy, or nauseated, and the feeling does not go away.  You notice new bruising at the needle insertion site.  You feel weaker than normal.  You have a fever or chills. Get help right away if:  You have severe  nausea or vomiting.  You have chest pain.  You have trouble breathing. This information is not intended to replace advice given to you by your health care provider. Make sure you discuss any questions you have with your health care provider. Document Released: 03/01/2011 Document Revised: 05/29/2016 Document Reviewed: 09/23/2014 Elsevier Interactive Patient Education  2017 [employer].

## 2017-02-11 NOTE — Progress Notes (Signed)
Austin Summers presents today for phlebotomy per MD orders. Phlebotomy procedure started at 1410 and ended at 1442 with 500 grams removed via 20G to R hand, 2nd attempt. Patient observed for 30 minutes after procedure without any incident. Diet and nutrition offered. Patient tolerated procedure well. IV needle removed intact.

## 2017-02-17 ENCOUNTER — Ambulatory Visit (HOSPITAL_BASED_OUTPATIENT_CLINIC_OR_DEPARTMENT_OTHER): Payer: 59

## 2017-02-17 ENCOUNTER — Other Ambulatory Visit: Payer: Self-pay | Admitting: Family

## 2017-02-17 NOTE — Progress Notes (Signed)
Austin Summers presents today for phlebotomy per MD orders. Phlebotomy procedure started at 1335 and ended at 1342. 500 grams removed using 16g phlebotomy kit. Patient observed for 30 minutes after procedure without any incident. Patient tolerated procedure well. IV needle removed intact.

## 2017-02-17 NOTE — Patient Instructions (Signed)
Therapeutic Phlebotomy Therapeutic phlebotomy is the controlled removal of blood from a person's body for the purpose of treating a medical condition. The procedure is similar to donating blood. Usually, about a pint (470 mL, or 0.47L) of blood is removed. The average adult has 9-12 pints (4.3-5.7 L) of blood. Therapeutic phlebotomy may be used to treat the following medical conditions:  Hemochromatosis. This is a condition in which the blood contains too much iron.  Polycythemia vera. This is a condition in which the blood contains too many red blood cells.  Porphyria cutanea tarda. This is a disease in which an important part of hemoglobin is not made properly. It results in the buildup of abnormal amounts of porphyrins in the body.  Sickle cell disease. This is a condition in which the red blood cells form an abnormal crescent shape rather than a round shape. Tell a health care provider about:  Any allergies you have.  All medicines you are taking, including vitamins, herbs, eye drops, creams, and over-the-counter medicines.  Any problems you or family members have had with anesthetic medicines.  Any blood disorders you have.  Any surgeries you have had.  Any medical conditions you have. What are the risks? Generally, this is a safe procedure. However, problems may occur, including:  Nausea or light-headedness.  Low blood pressure.  Soreness, bleeding, swelling, or bruising at the needle insertion site.  Infection. What happens before the procedure?  Follow instructions from your health care provider about eating or drinking restrictions.  Ask your health care provider about changing or stopping your regular medicines. This is especially important if you are taking diabetes medicines or blood thinners.  Wear clothing with sleeves that can be raised above the elbow.  Plan to have someone take you home after the procedure.  You may have a blood sample taken. What happens  during the procedure?  A needle will be inserted into one of your veins.  Tubing and a collection bag will be attached to that needle.  Blood will flow through the needle and tubing into the collection bag.  You may be asked to open and close your hand slowly and continually during the entire collection.  After the specified amount of blood has been removed from your body, the collection bag and tubing will be clamped.  The needle will be removed from your vein.  Pressure will be held on the site of the needle insertion to stop the bleeding.  A bandage (dressing) will be placed over the needle insertion site. The procedure may vary among health care providers and hospitals. What happens after the procedure?  Your recovery will be assessed and monitored.  You can return to your normal activities as directed by your health care provider. This information is not intended to replace advice given to you by your health care provider. Make sure you discuss any questions you have with your health care provider. Document Released: 03/01/2011 Document Revised: 05/29/2016 Document Reviewed: 09/23/2014 Elsevier Interactive Patient Education  2017 [employer]  

## 2017-02-25 ENCOUNTER — Ambulatory Visit (HOSPITAL_BASED_OUTPATIENT_CLINIC_OR_DEPARTMENT_OTHER): Payer: BLUE CROSS/BLUE SHIELD

## 2017-02-25 NOTE — Patient Instructions (Signed)
     Therapeutic Phlebotomy, Care After Refer to this sheet in the next few weeks. These instructions provide you with information about caring for yourself after your procedure. Your health care provider may also give you more specific instructions. Your treatment has been planned according to current medical practices, but problems sometimes occur. Call your health care provider if you have any problems or questions after your procedure. What can I expect after the procedure? After the procedure, it is common to have:  Light-headedness or dizziness. You may feel faint.  Nausea.  Tiredness. Follow these instructions at home: Activity  Return to your normal activities as directed by your health care provider. Most people can go back to their normal activities right away.  Avoid strenuous physical activity and heavy lifting or pulling for about 5 hours after the procedure. Do not lift anything that is heavier than 10 lb (4.5 kg).  Athletes should avoid strenuous exercise for at least 12 hours.  Change positions slowly for the remainder of the day. This will help to prevent light-headedness or fainting.  If you feel light-headed, lie down until the feeling goes away. Eating and drinking  Be sure to eat well-balanced meals for the next 24 hours.  Drink enough fluid to keep your urine clear or pale yellow.  Avoid drinking alcohol on the day that you had the procedure. Care of the Needle Insertion Site  Keep your bandage dry. You can remove the bandage after about 5 hours or as directed by your health care provider.  If you have bleeding from the needle insertion site, elevate your arm and press firmly on the site until the bleeding stops.  If you have bruising at the site, apply ice to the area:  Put ice in a plastic bag.  Place a towel between your skin and the bag.  Leave the ice on for 20 minutes, 2-3 times a day for the first 24 hours.  If the swelling does not go away  after 24 hours, apply a warm, moist washcloth to the area for 20 minutes, 2-3 times a day. General instructions  Avoid smoking for at least 30 minutes after the procedure.  Keep all follow-up visits as directed by your health care provider. It is important to continue with further therapeutic phlebotomy treatments as directed. Contact a health care provider if:  You have redness, swelling, or pain at the needle insertion site.  You have fluid, blood, or pus coming from the needle insertion site.  You feel light-headed, dizzy, or nauseated, and the feeling does not go away.  You notice new bruising at the needle insertion site.  You feel weaker than normal.  You have a fever or chills. Get help right away if:  You have severe nausea or vomiting.  You have chest pain.  You have trouble breathing. This information is not intended to replace advice given to you by your health care provider. Make sure you discuss any questions you have with your health care provider. Document Released: 03/01/2011 Document Revised: 05/29/2016 Document Reviewed: 09/23/2014 Elsevier Interactive Patient Education  2017 Elsevier Inc.  

## 2017-03-01 DIAGNOSIS — Z79899 Other long term (current) drug therapy: Secondary | ICD-10-CM | POA: Diagnosis not present

## 2017-03-01 DIAGNOSIS — E7800 Pure hypercholesterolemia, unspecified: Secondary | ICD-10-CM | POA: Diagnosis not present

## 2017-05-06 ENCOUNTER — Other Ambulatory Visit: Payer: Self-pay | Admitting: *Deleted

## 2017-05-09 ENCOUNTER — Other Ambulatory Visit: Payer: BLUE CROSS/BLUE SHIELD

## 2017-05-10 ENCOUNTER — Other Ambulatory Visit (HOSPITAL_BASED_OUTPATIENT_CLINIC_OR_DEPARTMENT_OTHER): Payer: BLUE CROSS/BLUE SHIELD

## 2017-05-10 LAB — CBC WITH DIFFERENTIAL (CANCER CENTER ONLY)
BASO#: 0 10*3/uL (ref 0.0–0.2)
BASO%: 0.5 % (ref 0.0–2.0)
EOS%: 2.3 % (ref 0.0–7.0)
Eosinophils Absolute: 0.1 10*3/uL (ref 0.0–0.5)
HEMATOCRIT: 40.6 % (ref 38.7–49.9)
HEMOGLOBIN: 13.4 g/dL (ref 13.0–17.1)
LYMPH#: 1.5 10*3/uL (ref 0.9–3.3)
LYMPH%: 34 % (ref 14.0–48.0)
MCH: 30.7 pg (ref 28.0–33.4)
MCHC: 33 g/dL (ref 32.0–35.9)
MCV: 93 fL (ref 82–98)
MONO#: 0.5 10*3/uL (ref 0.1–0.9)
MONO%: 10.6 % (ref 0.0–13.0)
NEUT%: 52.6 % (ref 40.0–80.0)
NEUTROS ABS: 2.2 10*3/uL (ref 1.5–6.5)
PLATELETS: 234 10*3/uL (ref 145–400)
RBC: 4.36 10*6/uL (ref 4.20–5.70)
RDW: 13.8 % (ref 11.1–15.7)
WBC: 4.3 10*3/uL (ref 4.0–10.0)

## 2017-05-10 LAB — COMPREHENSIVE METABOLIC PANEL
ALK PHOS: 83 U/L (ref 40–150)
ALT: 33 U/L (ref 0–55)
ANION GAP: 10 meq/L (ref 3–11)
AST: 31 U/L (ref 5–34)
Albumin: 4.1 g/dL (ref 3.5–5.0)
BILIRUBIN TOTAL: 0.91 mg/dL (ref 0.20–1.20)
BUN: 11.4 mg/dL (ref 7.0–26.0)
CALCIUM: 9.5 mg/dL (ref 8.4–10.4)
CO2: 24 mEq/L (ref 22–29)
CREATININE: 0.9 mg/dL (ref 0.7–1.3)
Chloride: 103 mEq/L (ref 98–109)
EGFR: >90 mL/min/{1.73_m2} (ref >90)
Glucose: 89 mg/dl (ref 70–140)
Potassium: 4.2 mEq/L (ref 3.5–5.1)
Sodium: 137 mEq/L (ref 136–145)
TOTAL PROTEIN: 8.2 g/dL (ref 6.4–8.3)

## 2017-05-10 LAB — IRON AND TIBC
%SAT: 17 % — ABNORMAL LOW (ref 20–55)
IRON: 58 ug/dL (ref 42–163)
TIBC: 329 ug/dL (ref 202–409)
UIBC: 271 ug/dL (ref 117–376)

## 2017-05-10 LAB — FERRITIN: FERRITIN: 17 ng/mL — AB (ref 22–316)

## 2017-05-11 ENCOUNTER — Telehealth: Payer: Self-pay

## 2017-05-11 NOTE — Telephone Encounter (Addendum)
-----   Message from Josph MachoPeter R Ennever, MD sent at 05/11/2017  6:20 AM EDT ----- Call - iron level is great!!!! NO need for phlebotomy!!!  Cindee Lameete  Above message provided to pt via phone. dph

## 2017-05-16 ENCOUNTER — Ambulatory Visit (HOSPITAL_BASED_OUTPATIENT_CLINIC_OR_DEPARTMENT_OTHER): Payer: BLUE CROSS/BLUE SHIELD | Admitting: Hematology & Oncology

## 2017-05-16 NOTE — Progress Notes (Signed)
Hematology and Oncology Follow Up Visit  Austin Summers [id number] [birth year] 56 y.o. 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  Current Therapy:    Phlebotomy to maintain ferritin less than 100     Interim History:  Mr.  Austin Summers is back for followup.he is doing pretty well. The big news is he is now retired. He has been retired for a couple of months. He is enjoying his time off. However, he is interviewing for new positions. He is in no rush to get back to work however. His wife is working. His children are out of the house.  His hemochromatosis is done incredibly well. He had labs done last week, his ferritin was only 17 with iron saturation of 17%.  He's had no abdominal pain. He now is on Lipitor. His cholesterol has responded quite nicely. He is only on 10 mg a day.  He's had no problems with fever. He's had no cough. He's had no change in bowel or bladder habits. He's had no nausea or vomiting. He's had no rashes.   Overall, his performance status is ECOG 0.  Medications:  Current Outpatient Prescriptions:  .  aspirin 81 MG tablet, Take 81 mg by mouth daily., Disp: , Rfl:  .  atorvastatin (LIPITOR) 10 MG tablet, Take 10 mg by mouth daily., Disp: , Rfl:   Allergies:  Allergies  Allergen Reactions  . Other Shortness Of Breath    BEES  . Penicillins Other (See Comments)    Had as child does not know reaction.    Past Medical History, Surgical history, Social history, and Family History were reviewed and updated.  Review of Systems: As above  Physical Exam:  weight is 209 lb (94.8 kg). His oral temperature is 98.6 F (37 C). His blood pressure is 139/87 and his pulse is 66. His respiration is 18 and oxygen saturation is 99%.   Well-developed and well-nourished white 7. Head and neck exam shows no ocular or oral lesions. He has no palpable cervical or supraclavicular lymph nodes. Lungs are clear bilateral. Cardiac exam  regular rate and rhythm with no murmurs rubs or bruits. Abdomen is soft. Has good bowel sounds. There is no fluid wave. There is no palpable liver or spleen tip. Back exam shows no tenderness over the spine ribs or hips. Extremities shows no clubbing cyanosis or edema. Neurological exam shows no focal neurological deficits.  Lab Results  Component Value Date   WBC 4.3 05/10/2017   HGB 13.4 05/10/2017   HCT 40.6 05/10/2017   MCV 93 05/10/2017   PLT 234 05/10/2017     Chemistry      Component Value Date/Time   NA 137 05/10/2017 1153   K 4.2 05/10/2017 1153   CL 102 02/07/2017 0926   CO2 24 05/10/2017 1153   BUN 11.4 05/10/2017 1153   CREATININE 0.9 05/10/2017 1153      Component Value Date/Time   CALCIUM 9.5 05/10/2017 1153   ALKPHOS 83 05/10/2017 1153   AST 31 05/10/2017 1153   ALT 33 05/10/2017 1153   BILITOT 0.91 05/10/2017 1153         Impression and Plan: Mr. Austin Summers is 56 year old gentleman with hemochromatosis. He is homozygous for the major mutation. He has done incredibly well.   I'm so happy that he is retired. He has been working a lot over the years I have known him. He is traveled quite extensively, usually halfway around the world.  It sounds  like he is looking at other job opportunities. I am sure that he will get right job for him and his family.  We will continue to check his iron studies every 3 months. I will see him back in 6 months.   [name], MD 8/6/201811:44 AM

## 2017-08-15 ENCOUNTER — Other Ambulatory Visit (HOSPITAL_BASED_OUTPATIENT_CLINIC_OR_DEPARTMENT_OTHER): Payer: BLUE CROSS/BLUE SHIELD

## 2017-08-15 LAB — CBC WITH DIFFERENTIAL (CANCER CENTER ONLY)
BASO#: 0 10*3/uL (ref 0.0–0.2)
BASO%: 0.5 % (ref 0.0–2.0)
EOS%: 3.6 % (ref 0.0–7.0)
Eosinophils Absolute: 0.2 10*3/uL (ref 0.0–0.5)
HEMATOCRIT: 42.9 % (ref 38.7–49.9)
HEMOGLOBIN: 14.5 g/dL (ref 13.0–17.1)
LYMPH#: 1.7 10*3/uL (ref 0.9–3.3)
LYMPH%: 40.5 % (ref 14.0–48.0)
MCH: 32.4 pg (ref 28.0–33.4)
MCHC: 33.8 g/dL (ref 32.0–35.9)
MCV: 96 fL (ref 82–98)
MONO#: 0.4 10*3/uL (ref 0.1–0.9)
MONO%: 8.6 % (ref 0.0–13.0)
NEUT%: 46.8 % (ref 40.0–80.0)
NEUTROS ABS: 2 10*3/uL (ref 1.5–6.5)
Platelets: 201 10*3/uL (ref 145–400)
RBC: 4.47 10*6/uL (ref 4.20–5.70)
RDW: 14.6 % (ref 11.1–15.7)
WBC: 4.2 10*3/uL (ref 4.0–10.0)

## 2017-08-15 LAB — CMP (CANCER CENTER ONLY)
ALBUMIN: 3.8 g/dL (ref 3.3–5.5)
ALK PHOS: 66 U/L (ref 26–84)
ALT: 49 U/L — AB (ref 10–47)
AST: 46 U/L — AB (ref 11–38)
BILIRUBIN TOTAL: 0.7 mg/dL (ref 0.20–1.60)
BUN, Bld: 9 mg/dL (ref 7–22)
CALCIUM: 9.4 mg/dL (ref 8.0–10.3)
CO2: 28 meq/L (ref 18–33)
Chloride: 108 mEq/L (ref 98–108)
Creat: 1 mg/dl (ref 0.6–1.2)
GLUCOSE: 93 mg/dL (ref 73–118)
POTASSIUM: 4.7 meq/L (ref 3.3–4.7)
Sodium: 150 mEq/L — ABNORMAL HIGH (ref 128–145)
Total Protein: 8.1 g/dL (ref 6.4–8.1)

## 2017-08-15 LAB — IRON AND TIBC
%SAT: 26 % (ref 20–55)
IRON: 72 ug/dL (ref 42–163)
TIBC: 273 ug/dL (ref 202–409)
UIBC: 201 ug/dL (ref 117–376)

## 2017-08-15 LAB — FERRITIN: Ferritin: 34 ng/ml (ref 22–316)

## 2017-08-16 ENCOUNTER — Telehealth: Payer: Self-pay | Admitting: *Deleted
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
## 2017-11-14 ENCOUNTER — Other Ambulatory Visit: Payer: BLUE CROSS/BLUE SHIELD

## 2017-11-14 ENCOUNTER — Inpatient Hospital Stay: Payer: BLUE CROSS/BLUE SHIELD | Attending: Hematology & Oncology

## 2017-11-14 LAB — CBC WITH DIFFERENTIAL (CANCER CENTER ONLY)
BASOS ABS: 0 10*3/uL (ref 0.0–0.1)
BASOS PCT: 0 %
EOS ABS: 0.2 10*3/uL (ref 0.0–0.5)
EOS PCT: 3 %
HCT: 45.2 % (ref 38.7–49.9)
Hemoglobin: 15.3 g/dL (ref 13.0–17.1)
Lymphocytes Relative: 33 %
Lymphs Abs: 1.8 10*3/uL (ref 0.9–3.3)
MCH: 33.8 pg — ABNORMAL HIGH (ref 28.0–33.4)
MCHC: 33.8 g/dL (ref 32.0–35.9)
MCV: 99.8 fL — ABNORMAL HIGH (ref 82.0–98.0)
MONO ABS: 0.7 10*3/uL (ref 0.1–0.9)
Monocytes Relative: 12 %
NEUTROS ABS: 2.8 10*3/uL (ref 1.5–6.5)
Neutrophils Relative %: 52 %
PLATELETS: 203 10*3/uL (ref 145–400)
RBC: 4.53 MIL/uL (ref 4.20–5.70)
RDW: 12.1 % (ref 11.1–15.7)
WBC Count: 5.4 10*3/uL (ref 4.0–10.0)

## 2017-11-14 LAB — FERRITIN: FERRITIN: 33 ng/mL (ref 22–316)

## 2017-11-14 LAB — IRON AND TIBC
Iron: 125 ug/dL (ref 42–163)
SATURATION RATIOS: 48 % (ref 42–163)
TIBC: 260 ug/dL (ref 202–409)
UIBC: 135 ug/dL

## 2017-11-14 LAB — CMP (CANCER CENTER ONLY)
ALBUMIN: 3.9 g/dL (ref 3.5–5.0)
ALT: 33 U/L (ref 0–55)
AST: 32 U/L (ref 5–34)
Alkaline Phosphatase: 76 U/L (ref 40–150)
Anion gap: 10 (ref 3–11)
BUN: 18 mg/dL (ref 7–26)
CHLORIDE: 107 mmol/L (ref 98–109)
CO2: 24 mmol/L (ref 22–29)
Calcium: 9.4 mg/dL (ref 8.4–10.4)
Creatinine: 0.97 mg/dL (ref 0.70–1.30)
GFR, Est AFR Am: >60 mL/min (ref >60)
GFR, Estimated: >60 mL/min (ref >60)
GLUCOSE: 103 mg/dL (ref 70–140)
POTASSIUM: 5.1 mmol/L (ref 3.5–5.1)
Sodium: 141 mmol/L (ref 136–145)
Total Bilirubin: 0.5 mg/dL (ref 0.2–1.2)
Total Protein: 8.1 g/dL (ref 6.4–8.3)

## 2017-11-15 ENCOUNTER — Telehealth: Payer: Self-pay | Admitting: *Deleted

## 2017-11-15 NOTE — Telephone Encounter (Addendum)
Patient is aware of results.   ----- Message from Josph MachoPeter R Ennever, MD sent at 11/14/2017  5:20 PM EST ----- Call - iron level is still ok!!  No phlebotomy!!  Cindee LamePete

## 2017-11-21 ENCOUNTER — Ambulatory Visit: Payer: BLUE CROSS/BLUE SHIELD | Admitting: Hematology & Oncology

## 2017-11-21 ENCOUNTER — Other Ambulatory Visit: Payer: Self-pay

## 2017-11-21 ENCOUNTER — Encounter: Payer: Self-pay | Admitting: Hematology & Oncology

## 2017-11-21 ENCOUNTER — Inpatient Hospital Stay (HOSPITAL_BASED_OUTPATIENT_CLINIC_OR_DEPARTMENT_OTHER): Payer: BLUE CROSS/BLUE SHIELD | Admitting: Hematology & Oncology

## 2017-11-21 MED ORDER — SULFACETAMIDE SODIUM 10 % OP SOLN: 2.0000 [drp] | Freq: Three times a day (TID) | OPHTHALMIC | 0 refills | Status: AC

## 2017-11-21 NOTE — Progress Notes (Signed)
Hematology and Oncology Follow Up Visit  Austin Summers [id number] [birth year] 57 y.o. 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  Current Therapy:    Phlebotomy to maintain ferritin less than 100     Interim History:  Austin Summers is back for followup.  He is back to work.  He does quite a bit of traveling.  He is off to [place] soon.  He has done well with the phlebotomies.  We have really done nicely keeping his iron levels down.  We last checked his iron levels a week ago, his ferritin was 33.  His iron saturation was 48%.  He enjoyed the holidays.  His family is doing well.  He will be married for 4 years on Valentine's Day.  He has had no headache.  He has had no cough or shortness of breath.  He does have what appears to be an infection in the right eye.  He comes in with some slight ptosis of the right eye.  He says he has had this before but has been a while.  I will go ahead and call in some sodium Sulamyd eyedrops.  I told him that if his I does not get better in 2 days, that he probably needs to see his optometrist/ophthalmologist.  He has had no fever.  There is no one else who is been sick in the family.  He does state that there is some discharge from the right eye.    Overall, his performance status is ECOG 0.   . Medications:  Current Outpatient Medications:  .  aspirin 81 MG tablet, Take 81 mg by mouth daily., Disp: , Rfl:  .  atorvastatin (LIPITOR) 10 MG tablet, Take 10 mg by mouth daily., Disp: , Rfl:   Allergies:  Allergies  Allergen Reactions  . Other Shortness Of Breath    BEES  . Penicillins Other (See Comments)    Had as child does not know reaction.    Past Medical History, Surgical history, Social history, and Family History were reviewed and updated.  Review of Systems: Review of Systems  Constitutional: Negative.   HENT: Negative.   Eyes: Positive for blurred vision, pain, discharge  and redness.  Respiratory: Negative.   Cardiovascular: Negative.   Gastrointestinal: Negative.   Genitourinary: Negative.   Musculoskeletal: Negative.   Skin: Negative.   Neurological: Negative.   Endo/Heme/Allergies: Negative.   Psychiatric/Behavioral: Negative.      Physical Exam:  weight is 204 lb (92.5 kg). His oral temperature is 98.5 F (36.9 C). His blood pressure is 149/94 (abnormal) and his pulse is 76. His respiration is 19 and oxygen saturation is 100%.   Physical Exam  Constitutional: He is oriented to person, place, and time.  HENT:  Head: Normocephalic and atraumatic.  Mouth/Throat: Oropharynx is clear and moist.  Eyes: EOM are normal. Pupils are equal, round, and reactive to light. Right eye exhibits discharge.  Neck: Normal range of motion.  Cardiovascular: Normal rate, regular rhythm and normal heart sounds.  Pulmonary/Chest: Effort normal and breath sounds normal.  Abdominal: Soft. Bowel sounds are normal.  Musculoskeletal: Normal range of motion. He exhibits no edema, tenderness or deformity.  Lymphadenopathy:    He has no cervical adenopathy.  Neurological: He is alert and oriented to person, place, and time.  Skin: Skin is warm and dry. No rash noted. No erythema.  Psychiatric: He has a normal mood and affect. His behavior is normal. Judgment and  thought content normal.  Vitals reviewed.   Lab Results  Component Value Date   WBC 5.4 11/14/2017   HGB 14.5 08/15/2017   HCT 45.2 11/14/2017   MCV 99.8 (H) 11/14/2017   PLT 203 11/14/2017     Chemistry      Component Value Date/Time   NA 141 11/14/2017 0817   NA 150 (H) 08/15/2017 0852   NA 137 05/10/2017 1153   K 5.1 11/14/2017 0817   K 4.7 08/15/2017 0852   K 4.2 05/10/2017 1153   CL 107 11/14/2017 0817   CL 108 08/15/2017 0852   CO2 24 11/14/2017 0817   CO2 28 08/15/2017 0852   CO2 24 05/10/2017 1153   BUN 18 11/14/2017 0817   BUN 9 08/15/2017 0852   BUN 11.4 05/10/2017 1153   CREATININE  0.97 11/14/2017 0817   CREATININE 1.0 08/15/2017 0852   CREATININE 0.9 05/10/2017 1153      Component Value Date/Time   CALCIUM 9.4 11/14/2017 0817   CALCIUM 9.4 08/15/2017 0852   CALCIUM 9.5 05/10/2017 1153   ALKPHOS 76 11/14/2017 0817   ALKPHOS 66 08/15/2017 0852   ALKPHOS 83 05/10/2017 1153   AST 32 11/14/2017 0817   AST 31 05/10/2017 1153   ALT 33 11/14/2017 0817   ALT 49 (H) 08/15/2017 0852   ALT 33 05/10/2017 1153   BILITOT 0.5 11/14/2017 0817   BILITOT 0.91 05/10/2017 1153         Impression and Plan: Austin Summers is 57 year old gentleman with hemochromatosis. He is homozygous for the major mutation. He has done incredibly well.   The main problem today is the eye.  The right eye looks like there is some conjunctivitis.  Hopefully, the eyedrops will help.  His iron studies which were just done looked okay so I do not think he will need any issues with phlebotomy.  We will get him back in another 6 months.  He will come in 3 months just for labs.  [name], MD 2/11/20199:03 AM

## 2018-01-30 DIAGNOSIS — Z0000 Encounter for general adult medical examination without abnormal findings: Secondary | ICD-10-CM | POA: Diagnosis not present

## 2018-01-30 DIAGNOSIS — E7800 Pure hypercholesterolemia, unspecified: Secondary | ICD-10-CM | POA: Diagnosis not present

## 2018-01-30 DIAGNOSIS — Z125 Encounter for screening for malignant neoplasm of prostate: Secondary | ICD-10-CM | POA: Diagnosis not present

## 2018-02-13 ENCOUNTER — Other Ambulatory Visit: Payer: Self-pay

## 2018-02-13 ENCOUNTER — Ambulatory Visit: Payer: Self-pay | Admitting: Hematology & Oncology

## 2018-02-20 ENCOUNTER — Other Ambulatory Visit: Payer: BLUE CROSS/BLUE SHIELD

## 2018-04-03 DIAGNOSIS — D126 Benign neoplasm of colon, unspecified: Secondary | ICD-10-CM | POA: Diagnosis not present

## 2018-04-03 DIAGNOSIS — Z86010 Personal history of colonic polyps: Secondary | ICD-10-CM | POA: Diagnosis not present

## 2018-04-05 DIAGNOSIS — D126 Benign neoplasm of colon, unspecified: Secondary | ICD-10-CM | POA: Diagnosis not present

## 2018-05-22 ENCOUNTER — Other Ambulatory Visit: Payer: BLUE CROSS/BLUE SHIELD

## 2018-05-22 ENCOUNTER — Ambulatory Visit: Payer: BLUE CROSS/BLUE SHIELD | Admitting: Hematology & Oncology

## 2018-07-04 DIAGNOSIS — Z85828 Personal history of other malignant neoplasm of skin: Secondary | ICD-10-CM | POA: Diagnosis not present

## 2018-07-04 DIAGNOSIS — L570 Actinic keratosis: Secondary | ICD-10-CM | POA: Diagnosis not present

## 2019-04-11 DIAGNOSIS — D225 Melanocytic nevi of trunk: Secondary | ICD-10-CM | POA: Diagnosis not present

## 2019-04-11 DIAGNOSIS — L814 Other melanin hyperpigmentation: Secondary | ICD-10-CM | POA: Diagnosis not present

## 2019-04-11 DIAGNOSIS — C44629 Squamous cell carcinoma of skin of left upper limb, including shoulder: Secondary | ICD-10-CM | POA: Diagnosis not present

## 2019-04-11 DIAGNOSIS — L821 Other seborrheic keratosis: Secondary | ICD-10-CM | POA: Diagnosis not present

## 2019-04-11 DIAGNOSIS — L570 Actinic keratosis: Secondary | ICD-10-CM | POA: Diagnosis not present

## 2019-04-11 DIAGNOSIS — D485 Neoplasm of uncertain behavior of skin: Secondary | ICD-10-CM | POA: Diagnosis not present

## 2019-04-11 DIAGNOSIS — L820 Inflamed seborrheic keratosis: Secondary | ICD-10-CM | POA: Diagnosis not present

## 2019-04-12 DIAGNOSIS — Z79899 Other long term (current) drug therapy: Secondary | ICD-10-CM | POA: Diagnosis not present

## 2019-04-12 DIAGNOSIS — Z125 Encounter for screening for malignant neoplasm of prostate: Secondary | ICD-10-CM | POA: Diagnosis not present

## 2019-04-12 DIAGNOSIS — Z0000 Encounter for general adult medical examination without abnormal findings: Secondary | ICD-10-CM | POA: Diagnosis not present

## 2019-04-12 DIAGNOSIS — E7800 Pure hypercholesterolemia, unspecified: Secondary | ICD-10-CM | POA: Diagnosis not present

## 2019-06-07 DIAGNOSIS — D0462 Carcinoma in situ of skin of left upper limb, including shoulder: Secondary | ICD-10-CM | POA: Diagnosis not present

## 2019-06-07 DIAGNOSIS — C44629 Squamous cell carcinoma of skin of left upper limb, including shoulder: Secondary | ICD-10-CM | POA: Diagnosis not present

## 2019-07-23 ENCOUNTER — Other Ambulatory Visit: Payer: Self-pay

## 2019-07-23 DIAGNOSIS — Z20822 Contact with and (suspected) exposure to covid-19: Secondary | ICD-10-CM

## 2019-07-23 DIAGNOSIS — Z20828 Contact with and (suspected) exposure to other viral communicable diseases: Secondary | ICD-10-CM | POA: Diagnosis not present

## 2019-07-24 LAB — NOVEL CORONAVIRUS, NAA: SARS-CoV-2, NAA: NOT DETECTED

## 2019-07-26 DIAGNOSIS — R869 Unspecified abnormal finding in specimens from male genital organs: Secondary | ICD-10-CM | POA: Diagnosis not present

## 2019-07-26 LAB — NOVEL CORONAVIRUS, NAA: SARS-CoV-2, NAA: NOT DETECTED

## 2019-07-31 ENCOUNTER — Telehealth: Payer: Self-pay | Admitting: General Practice

## 2019-07-31 NOTE — Telephone Encounter (Signed)
Patient requesting call back from RN, as he states he was tested on 10/15 but has not gotten results. Per Epic, he was tested on 10/12 also. Patient received those results but I cannot find the other result.

## 2019-07-31 NOTE — Telephone Encounter (Signed)
Received COVID 19 test results via fax, from [facility].  Results abstracted into pt's chart.    Phone call to pt. and advised that his COVID 19 test result was negative; the virus was not detected.  Pt. Verb. Understanding.  Reported he got tested a 2nd time, before he could return to work, due to exposure.  Pt. denied any symptoms.

## 2019-07-31 NOTE — Telephone Encounter (Signed)
Phone call to pt.  Questioned where pt. Went to have recent COVID test, on 10/15.  Reported he was tested at the [facility].  Advised the result is not in the chart, and nurse will look into this further, and call pt. back.  Verb. Understanding.

## 2020-04-21 DIAGNOSIS — L578 Other skin changes due to chronic exposure to nonionizing radiation: Secondary | ICD-10-CM | POA: Diagnosis not present

## 2020-04-21 DIAGNOSIS — L570 Actinic keratosis: Secondary | ICD-10-CM | POA: Diagnosis not present

## 2020-04-21 DIAGNOSIS — L814 Other melanin hyperpigmentation: Secondary | ICD-10-CM | POA: Diagnosis not present

## 2020-04-21 DIAGNOSIS — L821 Other seborrheic keratosis: Secondary | ICD-10-CM | POA: Diagnosis not present

## 2020-04-21 DIAGNOSIS — D225 Melanocytic nevi of trunk: Secondary | ICD-10-CM | POA: Diagnosis not present

## 2020-05-09 DIAGNOSIS — E7800 Pure hypercholesterolemia, unspecified: Secondary | ICD-10-CM | POA: Diagnosis not present

## 2020-05-09 DIAGNOSIS — Z79899 Other long term (current) drug therapy: Secondary | ICD-10-CM | POA: Diagnosis not present

## 2020-05-09 DIAGNOSIS — Z125 Encounter for screening for malignant neoplasm of prostate: Secondary | ICD-10-CM | POA: Diagnosis not present

## 2020-05-09 DIAGNOSIS — Z0000 Encounter for general adult medical examination without abnormal findings: Secondary | ICD-10-CM | POA: Diagnosis not present

## 2020-10-14 ENCOUNTER — Other Ambulatory Visit: Payer: Self-pay | Admitting: Family Medicine

## 2020-10-14 ENCOUNTER — Ambulatory Visit
Admission: RE | Admit: 2020-10-14 | Discharge: 2020-10-14 | Disposition: A | Discharge status: Home / Self Care | Payer: BC Managed Care – PPO | Source: Ambulatory Visit | Attending: Family Medicine | Admitting: Family Medicine

## 2020-10-14 DIAGNOSIS — M7989 Other specified soft tissue disorders: Secondary | ICD-10-CM | POA: Diagnosis not present

## 2020-10-14 DIAGNOSIS — R52 Pain, unspecified: Secondary | ICD-10-CM

## 2020-10-14 DIAGNOSIS — M79672 Pain in left foot: Secondary | ICD-10-CM | POA: Diagnosis not present

## 2021-01-20 DIAGNOSIS — Z20822 Contact with and (suspected) exposure to covid-19: Secondary | ICD-10-CM | POA: Diagnosis not present

## 2021-03-02 IMAGING — DX DG FOOT COMPLETE 3+V*L*
3 series · 3 of 3 positions shown · non-contrast
Comparison: None.

CLINICAL DATA: Pain/swelling, possible gout

EXAM:
LEFT FOOT - COMPLETE 3+ VIEW

[dg foot 2 views left (1 of 3)]
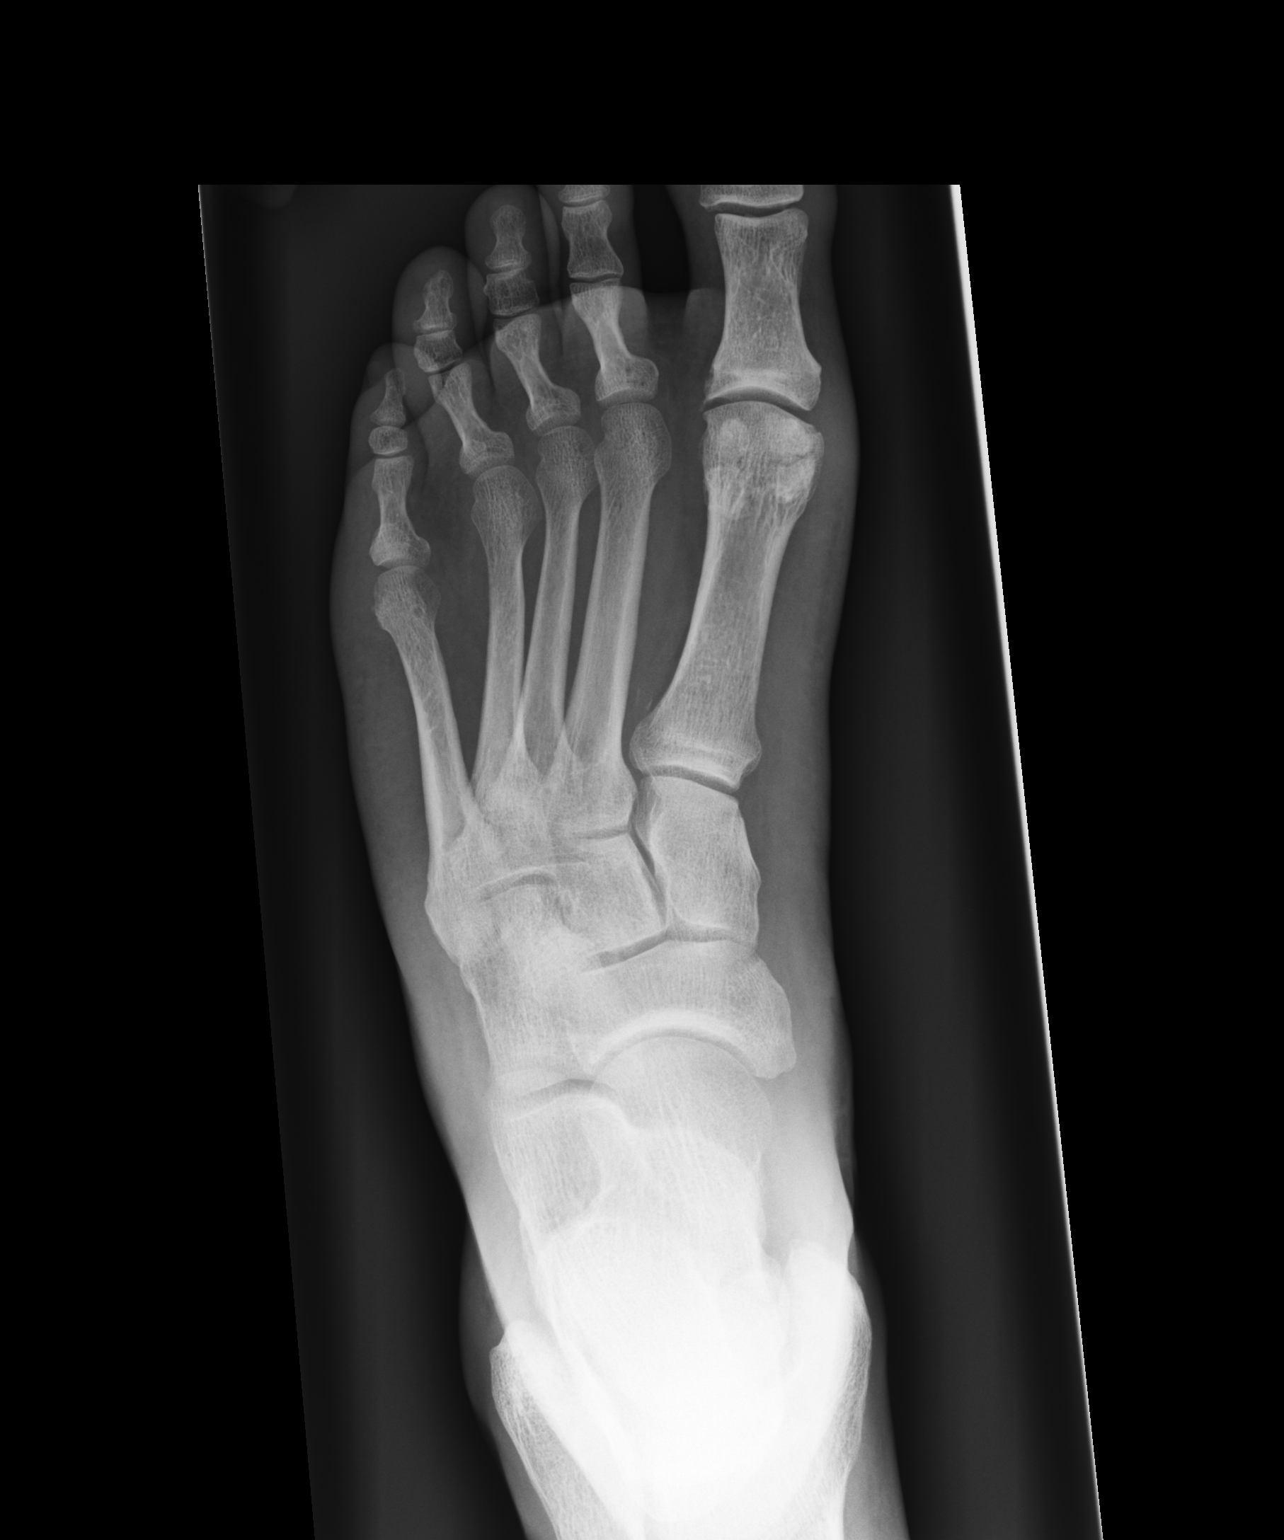

[dg foot 2 views left (2 of 3)]
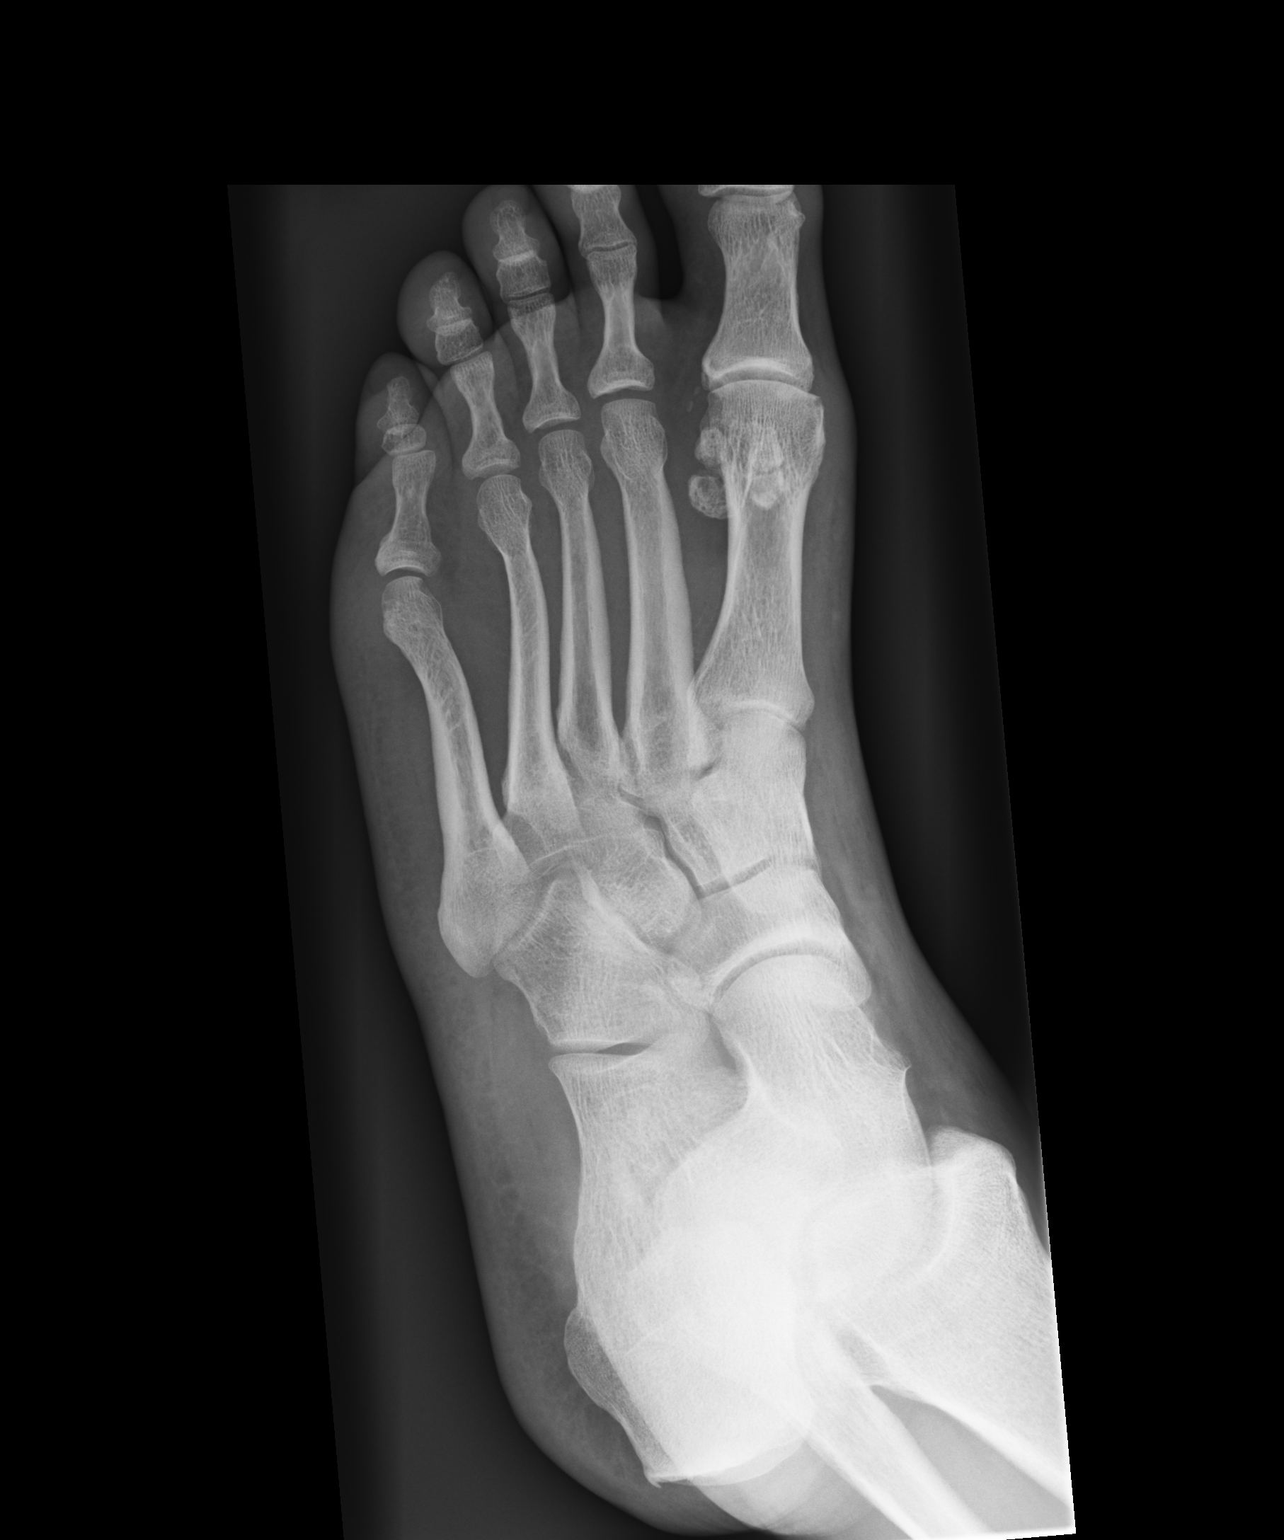

[dg foot 2 views left (3 of 3)]
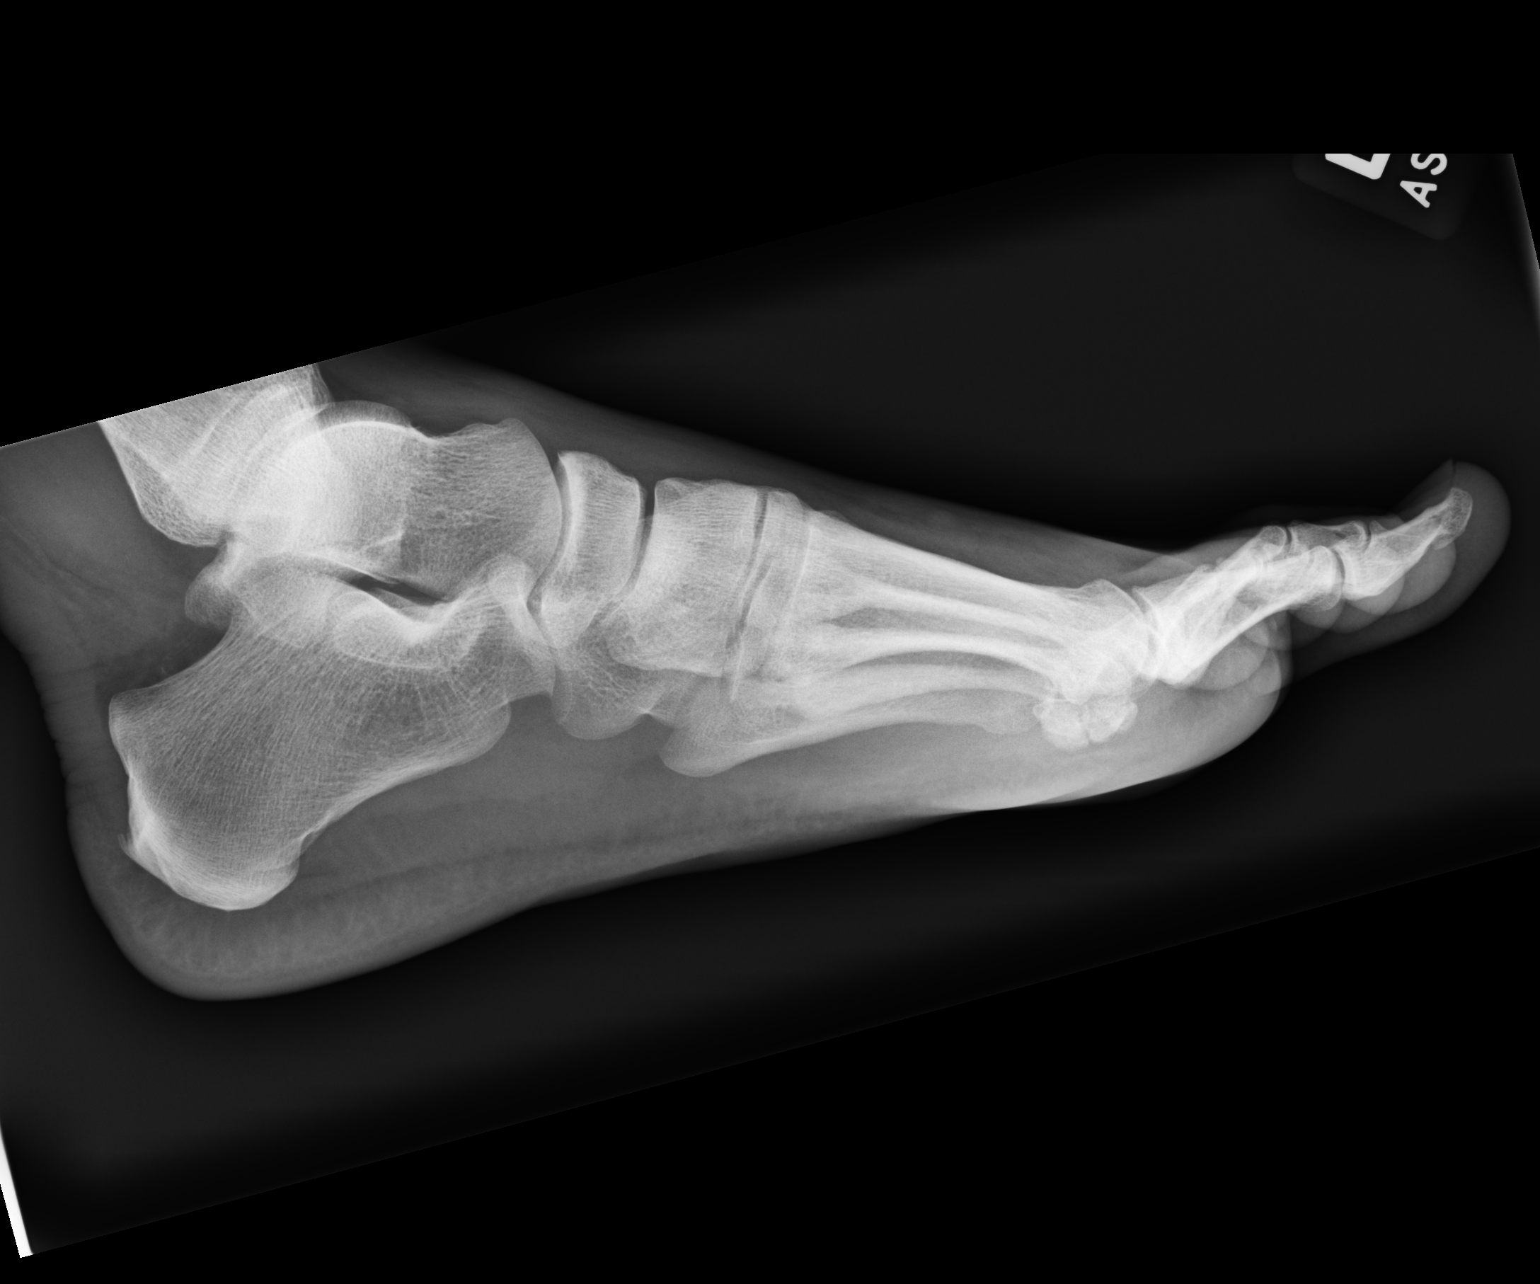

[3 of 3 positions shown; findings below may reference images not displayed]

FINDINGS: No fracture or dislocation is seen.

The joint spaces are preserved.  No marginal erosions.

Visualized soft tissues are within normal limits.
IMPRESSION: Negative.

## 2021-03-23 DIAGNOSIS — R030 Elevated blood-pressure reading, without diagnosis of hypertension: Secondary | ICD-10-CM | POA: Diagnosis not present

## 2021-03-23 DIAGNOSIS — R0789 Other chest pain: Secondary | ICD-10-CM | POA: Diagnosis not present

## 2021-04-17 DIAGNOSIS — R030 Elevated blood-pressure reading, without diagnosis of hypertension: Secondary | ICD-10-CM | POA: Diagnosis not present

## 2021-04-17 DIAGNOSIS — M6248 Contracture of muscle, other site: Secondary | ICD-10-CM | POA: Diagnosis not present

## 2021-04-27 DIAGNOSIS — L578 Other skin changes due to chronic exposure to nonionizing radiation: Secondary | ICD-10-CM | POA: Diagnosis not present

## 2021-04-27 DIAGNOSIS — L814 Other melanin hyperpigmentation: Secondary | ICD-10-CM | POA: Diagnosis not present

## 2021-04-27 DIAGNOSIS — L570 Actinic keratosis: Secondary | ICD-10-CM | POA: Diagnosis not present

## 2021-04-27 DIAGNOSIS — L821 Other seborrheic keratosis: Secondary | ICD-10-CM | POA: Diagnosis not present

## 2021-04-27 DIAGNOSIS — D225 Melanocytic nevi of trunk: Secondary | ICD-10-CM | POA: Diagnosis not present

## 2021-05-18 DIAGNOSIS — E7800 Pure hypercholesterolemia, unspecified: Secondary | ICD-10-CM | POA: Diagnosis not present

## 2021-05-18 DIAGNOSIS — Z125 Encounter for screening for malignant neoplasm of prostate: Secondary | ICD-10-CM | POA: Diagnosis not present

## 2021-05-18 DIAGNOSIS — Z0000 Encounter for general adult medical examination without abnormal findings: Secondary | ICD-10-CM | POA: Diagnosis not present

## 2021-05-18 DIAGNOSIS — Z79899 Other long term (current) drug therapy: Secondary | ICD-10-CM | POA: Diagnosis not present

## 2021-10-14 DIAGNOSIS — R509 Fever, unspecified: Secondary | ICD-10-CM | POA: Diagnosis not present

## 2021-10-14 DIAGNOSIS — Z03818 Encounter for observation for suspected exposure to other biological agents ruled out: Secondary | ICD-10-CM | POA: Diagnosis not present

## 2021-10-14 DIAGNOSIS — R059 Cough, unspecified: Secondary | ICD-10-CM | POA: Diagnosis not present

## 2022-05-24 DIAGNOSIS — Z79899 Other long term (current) drug therapy: Secondary | ICD-10-CM | POA: Diagnosis not present

## 2022-05-24 DIAGNOSIS — Z125 Encounter for screening for malignant neoplasm of prostate: Secondary | ICD-10-CM | POA: Diagnosis not present

## 2022-05-24 DIAGNOSIS — E7800 Pure hypercholesterolemia, unspecified: Secondary | ICD-10-CM | POA: Diagnosis not present

## 2022-05-24 DIAGNOSIS — Z0000 Encounter for general adult medical examination without abnormal findings: Secondary | ICD-10-CM | POA: Diagnosis not present

## 2022-05-25 DIAGNOSIS — D0461 Carcinoma in situ of skin of right upper limb, including shoulder: Secondary | ICD-10-CM | POA: Diagnosis not present

## 2022-05-25 DIAGNOSIS — L821 Other seborrheic keratosis: Secondary | ICD-10-CM | POA: Diagnosis not present

## 2022-05-25 DIAGNOSIS — D485 Neoplasm of uncertain behavior of skin: Secondary | ICD-10-CM | POA: Diagnosis not present

## 2022-05-25 DIAGNOSIS — L814 Other melanin hyperpigmentation: Secondary | ICD-10-CM | POA: Diagnosis not present

## 2022-05-25 DIAGNOSIS — L578 Other skin changes due to chronic exposure to nonionizing radiation: Secondary | ICD-10-CM | POA: Diagnosis not present

## 2022-05-25 DIAGNOSIS — D225 Melanocytic nevi of trunk: Secondary | ICD-10-CM | POA: Diagnosis not present

## 2022-05-25 DIAGNOSIS — C44329 Squamous cell carcinoma of skin of other parts of face: Secondary | ICD-10-CM | POA: Diagnosis not present

## 2022-05-26 ENCOUNTER — Encounter: Payer: Self-pay | Admitting: Family

## 2022-06-03 DIAGNOSIS — C44320 Squamous cell carcinoma of skin of unspecified parts of face: Secondary | ICD-10-CM | POA: Diagnosis not present

## 2022-06-21 ENCOUNTER — Ambulatory Visit: Payer: BC Managed Care – PPO | Admitting: Podiatry

## 2022-06-23 ENCOUNTER — Ambulatory Visit (INDEPENDENT_AMBULATORY_CARE_PROVIDER_SITE_OTHER): Payer: BC Managed Care – PPO

## 2022-06-23 ENCOUNTER — Ambulatory Visit (INDEPENDENT_AMBULATORY_CARE_PROVIDER_SITE_OTHER): Payer: BC Managed Care – PPO | Admitting: Podiatry

## 2022-06-23 DIAGNOSIS — G5762 Lesion of plantar nerve, left lower limb: Secondary | ICD-10-CM

## 2022-06-23 DIAGNOSIS — M7742 Metatarsalgia, left foot: Secondary | ICD-10-CM

## 2022-06-23 NOTE — Progress Notes (Signed)
   Chief Complaint  Patient presents with   Foot Pain    Patient is here for foot pain Left foot.he tates it feels like he is walking on something, concerned about hammertoe.    HPI: 61 y.o. male presenting today as a new patient for evaluation of pain and tenderness especially to the left forefoot.  Patient states that it feels as if he is walking on a roll of dimes underneath his toes.  He does have some irritability and tenderness to the plantar aspect of the ball of the foot.  He denies a history of injury.  He says that he has developed hammertoes over the past year.  He has not done anything for treatment  Past Medical History:  Diagnosis Date   Hemochromatosis 10/21/2011   No past surgical history on file.   Objective: Physical Exam General: The patient is alert and oriented x3 in no acute distress.  Dermatology: Skin is cool, dry and supple bilateral lower extremities. Negative for open lesions or macerations.  Vascular: Palpable pedal pulses bilaterally. No edema or erythema noted. Capillary refill within normal limits.  Neurological: Epicritic and protective threshold grossly intact bilaterally.   Musculoskeletal Exam: All pedal and ankle joints range of motion within normal limits bilateral. Muscle strength 5/5 in all groups bilateral. Hammertoe contracture deformity noted to the second digit of the bilateral feet left greater than the right.  Radiographic Exam LT foot 06/23/2022: Hammertoe contracture deformity noted to the interphalangeal joints and MPJ of the second digit of the left foot.  Slight dorsal subluxation of the second MTP also noted.  There is some degenerative fragmentation around the first and second MTP as well best visualized on medial oblique view   Assessment: 1.  Symptomatic hammertoe second digit left 2.  Metatarsalgia left foot   Plan of Care:  1. Patient evaluated. X-Rays reviewed.  2.  For now we are going to pursue conservative treatment.   Offloading felt metatarsal pads were dispensed today. 3.  Recommended custom molded orthotics to help support the medial longitudinal arch of the foot and offload pressure from the forefoot.  The custom orthotics would also have a metatarsal pad to add additional support and offloading the forefoot 4.  The patient would like to verify with insurance if they are covered or not.  Information was provided for him to call his insurance 5.  Return to clinic if he would like to be fitted for custom molded orthotics.  Otherwise as needed with me  *President of [employer] and Lorin Glass, DPM [facility]  Dr. Felecia Shelling, DPM    [address] [place], [place] [address]                Office [phone]  Fax [phone]

## 2022-06-23 NOTE — Patient Instructions (Addendum)
Custom orthotics CPT code L3020 to verify with insurance

## 2022-06-24 DIAGNOSIS — D0461 Carcinoma in situ of skin of right upper limb, including shoulder: Secondary | ICD-10-CM | POA: Diagnosis not present

## 2022-07-29 DIAGNOSIS — Z5181 Encounter for therapeutic drug level monitoring: Secondary | ICD-10-CM | POA: Diagnosis not present

## 2022-07-29 DIAGNOSIS — E7800 Pure hypercholesterolemia, unspecified: Secondary | ICD-10-CM | POA: Diagnosis not present

## 2022-07-29 DIAGNOSIS — R748 Abnormal levels of other serum enzymes: Secondary | ICD-10-CM | POA: Diagnosis not present

## 2022-08-30 DIAGNOSIS — C44329 Squamous cell carcinoma of skin of other parts of face: Secondary | ICD-10-CM | POA: Diagnosis not present

## 2022-09-23 DIAGNOSIS — Z8589 Personal history of malignant neoplasm of other organs and systems: Secondary | ICD-10-CM | POA: Diagnosis not present

## 2022-09-23 DIAGNOSIS — Z5189 Encounter for other specified aftercare: Secondary | ICD-10-CM | POA: Diagnosis not present
